# Patient Record
Sex: Female | Born: 1955
Health system: Southern US, Community
[De-identification: ages and names within clinical notes are randomized; demographics above are authoritative.]

## PROBLEM LIST (undated history)

## (undated) DIAGNOSIS — E785 Hyperlipidemia, unspecified: Secondary | ICD-10-CM

## (undated) DIAGNOSIS — R945 Abnormal results of liver function studies: Secondary | ICD-10-CM

## (undated) DIAGNOSIS — M1990 Unspecified osteoarthritis, unspecified site: Secondary | ICD-10-CM

## (undated) DIAGNOSIS — I10 Essential (primary) hypertension: Secondary | ICD-10-CM

## (undated) DIAGNOSIS — F32A Depression, unspecified: Secondary | ICD-10-CM

## (undated) DIAGNOSIS — F419 Anxiety disorder, unspecified: Secondary | ICD-10-CM

## (undated) DIAGNOSIS — K649 Unspecified hemorrhoids: Secondary | ICD-10-CM

## (undated) DIAGNOSIS — R7989 Other specified abnormal findings of blood chemistry: Secondary | ICD-10-CM

## (undated) DIAGNOSIS — E559 Vitamin D deficiency, unspecified: Secondary | ICD-10-CM

## (undated) DIAGNOSIS — F329 Major depressive disorder, single episode, unspecified: Secondary | ICD-10-CM

## (undated) DIAGNOSIS — K219 Gastro-esophageal reflux disease without esophagitis: Secondary | ICD-10-CM

## (undated) HISTORY — DX: Essential (primary) hypertension: I10

## (undated) HISTORY — DX: Abnormal results of liver function studies: R94.5

## (undated) HISTORY — DX: Unspecified hemorrhoids: K64.9

## (undated) HISTORY — DX: Anxiety disorder, unspecified: F41.9

## (undated) HISTORY — DX: Hyperlipidemia, unspecified: E78.5

## (undated) HISTORY — DX: Other specified abnormal findings of blood chemistry: R79.89

## (undated) HISTORY — DX: Vitamin D deficiency, unspecified: E55.9

## (undated) HISTORY — DX: Gastro-esophageal reflux disease without esophagitis: K21.9

## (undated) HISTORY — DX: Depression, unspecified: F32.A

## (undated) HISTORY — DX: Major depressive disorder, single episode, unspecified: F32.9

## (undated) HISTORY — DX: Unspecified osteoarthritis, unspecified site: M19.90

---
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
## 2002-02-02 HISTORY — PX: OTHER SURGICAL HISTORY: SHX169

## 2006-02-02 HISTORY — PX: ANKLE FRACTURE SURGERY: SHX122

## 2006-05-28 ENCOUNTER — Ambulatory Visit: Payer: Self-pay | Admitting: Unknown Physician Specialty

## 2006-05-28 ENCOUNTER — Other Ambulatory Visit: Payer: Self-pay

## 2006-05-29 ENCOUNTER — Inpatient Hospital Stay: Payer: Self-pay | Admitting: Specialist

## 2006-08-19 ENCOUNTER — Ambulatory Visit: Payer: Self-pay | Admitting: Specialist

## 2006-08-20 ENCOUNTER — Ambulatory Visit: Payer: Self-pay | Admitting: Specialist

## 2012-06-09 ENCOUNTER — Ambulatory Visit: Payer: Self-pay | Admitting: Otolaryngology

## 2013-03-08 LAB — CBC AND DIFFERENTIAL
HCT: 46 % (ref 36–46)
Hemoglobin: 15.8 g/dL (ref 12.0–16.0)
Neutrophils Absolute: 4 /uL
Platelets: 311 10*3/uL (ref 150–399)
WBC: 6.3 10*3/mL

## 2013-03-08 LAB — HEPATIC FUNCTION PANEL
ALT: 10 U/L (ref 7–35)
AST: 25 U/L (ref 13–35)
Alkaline Phosphatase: 132 U/L — AB (ref 25–125)

## 2013-03-08 LAB — LIPID PANEL
Cholesterol: 244 mg/dL — AB (ref 0–200)
HDL: 47 mg/dL (ref 35–70)
LDL CALC: 152 mg/dL
LDl/HDL Ratio: 3.2
Triglycerides: 227 mg/dL — AB (ref 40–160)

## 2013-03-08 LAB — BASIC METABOLIC PANEL
BUN: 11 mg/dL (ref 4–21)
CREATININE: 0.9 mg/dL (ref <=1.1)
Glucose: 105 mg/dL
POTASSIUM: 4.3 mmol/L (ref 3.4–5.3)
SODIUM: 138 mmol/L (ref 137–147)

## 2013-03-08 LAB — TSH: TSH: 2.85 u[IU]/mL (ref <=5.90)

## 2013-03-18 LAB — HEPATIC FUNCTION PANEL
ALK PHOS: 132 U/L — AB (ref 25–125)
AST: 25 U/L (ref 13–35)

## 2014-07-20 DIAGNOSIS — F419 Anxiety disorder, unspecified: Secondary | ICD-10-CM | POA: Insufficient documentation

## 2014-07-20 DIAGNOSIS — E559 Vitamin D deficiency, unspecified: Secondary | ICD-10-CM | POA: Insufficient documentation

## 2014-07-20 DIAGNOSIS — J189 Pneumonia, unspecified organism: Secondary | ICD-10-CM | POA: Insufficient documentation

## 2014-07-20 DIAGNOSIS — Z7289 Other problems related to lifestyle: Secondary | ICD-10-CM | POA: Insufficient documentation

## 2014-07-20 DIAGNOSIS — Z789 Other specified health status: Secondary | ICD-10-CM | POA: Insufficient documentation

## 2014-07-20 DIAGNOSIS — E785 Hyperlipidemia, unspecified: Secondary | ICD-10-CM | POA: Insufficient documentation

## 2014-07-20 DIAGNOSIS — F32A Depression, unspecified: Secondary | ICD-10-CM | POA: Insufficient documentation

## 2014-07-20 DIAGNOSIS — F410 Panic disorder [episodic paroxysmal anxiety] without agoraphobia: Secondary | ICD-10-CM | POA: Insufficient documentation

## 2014-07-20 DIAGNOSIS — M1990 Unspecified osteoarthritis, unspecified site: Secondary | ICD-10-CM | POA: Insufficient documentation

## 2014-07-20 DIAGNOSIS — K219 Gastro-esophageal reflux disease without esophagitis: Secondary | ICD-10-CM | POA: Insufficient documentation

## 2014-07-20 DIAGNOSIS — R945 Abnormal results of liver function studies: Secondary | ICD-10-CM | POA: Insufficient documentation

## 2014-07-20 DIAGNOSIS — I10 Essential (primary) hypertension: Secondary | ICD-10-CM | POA: Insufficient documentation

## 2014-07-20 DIAGNOSIS — R7989 Other specified abnormal findings of blood chemistry: Secondary | ICD-10-CM | POA: Insufficient documentation

## 2014-07-20 DIAGNOSIS — K649 Unspecified hemorrhoids: Secondary | ICD-10-CM | POA: Insufficient documentation

## 2014-07-20 DIAGNOSIS — F329 Major depressive disorder, single episode, unspecified: Secondary | ICD-10-CM | POA: Insufficient documentation

## 2014-08-27 ENCOUNTER — Telehealth: Payer: Self-pay

## 2014-08-27 NOTE — Telephone Encounter (Signed)
LMTCB, reviewed lab results-cholesterol very elevated advise patient to take her Simvastatin every day and work on habits, sugar is in almost diabetic range. Re check in 3 weeks (lipids, hepatic and A1C at that time). Labs did not go through computer-print out copy on desk 205-aa

## 2014-08-28 NOTE — Telephone Encounter (Signed)
Patient advised. Pt has appt on August 18th, advised patient we will order tests at that time and to make sure she is taking medication every day.-aa

## 2014-08-28 NOTE — Telephone Encounter (Signed)
Pt is returning call.  CB#[phone]/MW

## 2014-09-06 ENCOUNTER — Encounter: Payer: Self-pay | Admitting: Family Medicine

## 2014-09-20 ENCOUNTER — Ambulatory Visit (INDEPENDENT_AMBULATORY_CARE_PROVIDER_SITE_OTHER): Payer: No Typology Code available for payment source | Admitting: Family Medicine

## 2014-09-20 VITALS — BP 122/76 | HR 84 | Temp 97.9°F | Resp 16 | Wt 140.0 lb

## 2014-09-20 DIAGNOSIS — F411 Generalized anxiety disorder: Secondary | ICD-10-CM

## 2014-09-20 DIAGNOSIS — F329 Major depressive disorder, single episode, unspecified: Secondary | ICD-10-CM | POA: Diagnosis not present

## 2014-09-20 DIAGNOSIS — F32A Depression, unspecified: Secondary | ICD-10-CM

## 2014-09-20 DIAGNOSIS — E785 Hyperlipidemia, unspecified: Secondary | ICD-10-CM | POA: Diagnosis not present

## 2014-09-20 NOTE — Progress Notes (Signed)
Patient ID: Melanie Mitchell, female   DOB: [birth year], 59 y.o.   MRN: [id number]   MARIJOSE CURINGTON  MRN: [id number] DOB: [birth year]  Subjective:  HPI   1. Depression Patient is a 59 year old female who presents for follow up of her depression.  Her last visit was on 06/28/14.  At that time her depression was worsening due to the loss of her mother just prior to that visit.  She chose not to make any changes and wanted to see how she did during the time until this visit.  She is currently on Sertraline 50 mg daily.    2. GAD (generalized anxiety disorder) Patient is also here to follow up on her anxiety.  She takes Alprazolam for this and feels it helps with her symptoms.   3. Hyperlipidemia Patient had elevation of her levels on the last check but she had not been taking her medication.  She states she has been taking it every day now and it is time for her level to be checked.  Patient Active Problem List   Diagnosis Date Noted  . Abnormal LFTs 07/20/2014  . Alcohol drinker 07/20/2014  . Anxiety 07/20/2014  . Arthritis 07/20/2014  . Pneumonia, organism unspecified 07/20/2014  . Clinical depression 07/20/2014  . Acid reflux 07/20/2014  . HLD (hyperlipidemia) 07/20/2014  . BP (high blood pressure) 07/20/2014  . Hemorrhoid 07/20/2014  . Avitaminosis D 07/20/2014  . Panic attack 07/20/2014    Past Medical History  Diagnosis Date  . Vitamin D deficiency   . Hyperlipidemia   . Depression   . Anxiety   . Hypertension   . GERD (gastroesophageal reflux disease)   . Hemorrhoids   . Arthritis   . Abnormal LFTs     (Pt told to cut back on alcohol and tylenol.)    Social History   Social History  . Marital Status: Single    Spouse Name: N/A  . Number of Children: N/A  . Years of Education: N/A   Occupational History  . Not on file.   Social History Main Topics  . Smoking status: Current Every Day Smoker -- 1.00 packs/day    Types: Cigarettes  . Smokeless tobacco: Not  on file  . Alcohol Use: Yes     Comment: Occasional alcohol use.  . Drug Use: Not on file  . Sexual Activity: Not on file   Other Topics Concern  . Not on file   Social History Narrative  . No narrative on file    Outpatient Prescriptions Prior to Visit  Medication Sig Dispense Refill  . Cholecalciferol (VITAMIN D) 2000 UNITS tablet Take by mouth.    . clonazePAM (KLONOPIN) 1 MG tablet Take by mouth.    Marland Kitchen HYDROcodone-acetaminophen (VICODIN) 5-500 MG per tablet Take by mouth.    . losartan-hydrochlorothiazide (HYZAAR) 50-12.5 MG per tablet Take by mouth.    Marland Kitchen omeprazole (PRILOSEC) 20 MG capsule Take by mouth.    . sertraline (ZOLOFT) 50 MG tablet Take by mouth.    . simvastatin (ZOCOR) 40 MG tablet Take by mouth.     No facility-administered medications prior to visit.    Allergies  Allergen Reactions  . Atorvastatin Diarrhea  . Penicillins     Review of Systems  Constitutional: Negative.   Respiratory: Negative.   Cardiovascular: Negative.   Neurological: Negative for dizziness and headaches.  Psychiatric/Behavioral: Positive for depression. Negative for suicidal ideas and memory loss. The patient is nervous/anxious. The patient does not  have insomnia.    Objective:  There were no vitals taken for this visit.  Physical Exam  Constitutional: She is oriented to person, place, and time and well-developed, well-nourished, and in no distress.  HENT:  Head: Normocephalic and atraumatic.  Right Ear: External ear normal.  Left Ear: External ear normal.  Nose: Nose normal.  Eyes: Conjunctivae are normal.  Neck: Neck supple.  Cardiovascular: Normal rate, regular rhythm and normal heart sounds.   Pulmonary/Chest: Effort normal and breath sounds normal.  Neurological: She is alert and oriented to person, place, and time.  Skin: Skin is warm and dry.  Psychiatric: Mood, memory, affect and judgment normal.    Assessment and Plan :   1. Depression   2. GAD (generalized  anxiety disorder)   3. Hyperlipidemia  - Lipid Panel With LDL/HDL Ratio - COMPLETE METABOLIC PANEL WITH GFR I have done the exam and reviewed the above chart and it is accurate to the best of my knowledge.   Julieanne Manson MD [facility] 09/20/2014 3:21 PM

## 2014-12-06 ENCOUNTER — Encounter: Payer: Self-pay | Admitting: Family Medicine

## 2014-12-25 ENCOUNTER — Telehealth: Payer: Self-pay | Admitting: Family Medicine

## 2014-12-25 NOTE — Telephone Encounter (Signed)
Please review-aa 

## 2014-12-25 NOTE — Telephone Encounter (Signed)
ok 

## 2014-12-25 NOTE — Telephone Encounter (Signed)
Pt contacted office for refill request on the following medications: clonazePAM (KLONOPIN) 1 MG tablet to [facility]. Pt stated she needs it before 12/27/14 and needs enough to last until her appt in Jan. Thanks TNP

## 2014-12-28 MED ORDER — CLONAZEPAM 1 MG PO TABS: 1.0000 mg | ORAL_TABLET | Freq: Two times a day (BID) | ORAL | Status: DC | PRN

## 2014-12-28 NOTE — Telephone Encounter (Signed)
Done-aa 

## 2014-12-31 ENCOUNTER — Other Ambulatory Visit: Payer: Self-pay | Admitting: Family Medicine

## 2015-02-13 ENCOUNTER — Encounter: Payer: Self-pay | Admitting: Family Medicine

## 2015-02-21 ENCOUNTER — Encounter: Payer: Self-pay | Admitting: Family Medicine

## 2015-02-21 ENCOUNTER — Ambulatory Visit (INDEPENDENT_AMBULATORY_CARE_PROVIDER_SITE_OTHER): Payer: BLUE CROSS/BLUE SHIELD | Admitting: Family Medicine

## 2015-02-21 VITALS — BP 142/70 | HR 72 | Temp 97.5°F | Resp 12 | Ht 67.5 in | Wt 140.0 lb

## 2015-02-21 DIAGNOSIS — Z1211 Encounter for screening for malignant neoplasm of colon: Secondary | ICD-10-CM | POA: Diagnosis not present

## 2015-02-21 DIAGNOSIS — R0989 Other specified symptoms and signs involving the circulatory and respiratory systems: Secondary | ICD-10-CM

## 2015-02-21 DIAGNOSIS — Z720 Tobacco use: Secondary | ICD-10-CM

## 2015-02-21 DIAGNOSIS — Z0000 Encounter for general adult medical examination without abnormal findings: Secondary | ICD-10-CM

## 2015-02-21 DIAGNOSIS — Z1239 Encounter for other screening for malignant neoplasm of breast: Secondary | ICD-10-CM | POA: Diagnosis not present

## 2015-02-21 DIAGNOSIS — Z124 Encounter for screening for malignant neoplasm of cervix: Secondary | ICD-10-CM | POA: Diagnosis not present

## 2015-02-21 DIAGNOSIS — F411 Generalized anxiety disorder: Secondary | ICD-10-CM

## 2015-02-21 NOTE — Progress Notes (Signed)
Patient ID: Melanie Mitchell, female   DOB: March 21, 1955, 60 y.o.   MRN: [id number]  Visit Date: 02/21/2015  Today's Provider: Megan Mans, MD   Chief Complaint  Patient presents with  . Annual Exam   Subjective:  Melanie Mitchell is a 60 y.o. female who presents today for health maintenance and complete physical. She feels well. She reports exercising none due to broken ankle from years ago. She reports she is sleeping well. Immunization History  Administered Date(s) Administered  . Influenza,inj,Quad PF,36+ Mos 11/21/2014  . Pneumococcal Polysaccharide-23 08/15/2012  . Tdap 08/15/2012   LAST BMD 08/20/11 some osteopenia, some osteoporosis.  Pap smear 06/10/10 normal, never had hysterectomy, had 1 abnormal pap smear years ago with Dr. Barnabas Lister and they did something in the office and no issues after that.  Mammogram 08/18/11  Colonoscopy- pre patient years ago more than 10 years ago.  Patient states she gets recurrent hemorrhoids.  Review of Systems  Constitutional: Negative.   HENT: Negative.   Eyes: Negative.   Respiratory: Negative.   Cardiovascular: Positive for leg swelling.  Gastrointestinal: Negative.   Endocrine: Negative.   Genitourinary: Negative.   Musculoskeletal: Negative.   Skin: Negative.   Allergic/Immunologic: Negative.   Neurological: Negative.   Hematological: Negative.   Psychiatric/Behavioral: Negative.     Social History   Social History  . Marital Status: Single    Spouse Name: N/A  . Number of Children: N/A  . Years of Education: N/A   Occupational History  . Not on file.   Social History Main Topics  . Smoking status: Current Every Day Smoker -- 1.00 packs/day for 30 years    Types: Cigarettes  . Smokeless tobacco: Never Used  . Alcohol Use: Yes     Comment: drinks 3 to 4 beers 2 to 3 times a week.  . Drug Use: No  . Sexual Activity: No   Other Topics Concern  . Not on file   Social History Narrative    Patient Active  Problem List   Diagnosis Date Noted  . Abnormal LFTs 07/20/2014  . Alcohol drinker (HCC) 07/20/2014  . Anxiety 07/20/2014  . Arthritis 07/20/2014  . Pneumonia, organism unspecified 07/20/2014  . Clinical depression 07/20/2014  . Acid reflux 07/20/2014  . HLD (hyperlipidemia) 07/20/2014  . BP (high blood pressure) 07/20/2014  . Hemorrhoid 07/20/2014  . Avitaminosis D 07/20/2014  . Panic attack 07/20/2014    Past Surgical History  Procedure Laterality Date  . Ankle fracture surgery  2008    steel plate and screws put in  . Stone in the [employer] gland  2004  . 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 Tonsillectomy  1960    Her family history includes Anxiety disorder in her mother; Cancer in her father and mother; Hypercholesterolemia in her mother; Hypertension in her mother; Multiple sclerosis in her maternal aunt.    Outpatient Prescriptions Prior to Visit  Medication Sig Dispense Refill  . Cholecalciferol (VITAMIN D) 2000 UNITS tablet Take by mouth.    . clonazePAM (KLONOPIN) 1 MG tablet Take 1 tablet (1 mg total) by mouth 2 (two) times daily as needed for anxiety. 30 tablet 5  . HYDROcodone-acetaminophen (VICODIN) 5-500 MG per tablet Take by mouth.    . losartan-hydrochlorothiazide (HYZAAR) 50-12.5 MG per tablet Take by mouth.    Marland Kitchen omeprazole (PRILOSEC) 20 MG capsule Take by mouth.    . sertraline (ZOLOFT) 50 MG tablet Take by mouth.    Marland Kitchen  simvastatin (ZOCOR) 40 MG tablet Take by mouth.     No facility-administered medications prior to visit.    Patient Care Team: Maple Hudson., MD as PCP - General (Family Medicine)     Objective:   Vitals:  Filed Vitals:   02/21/15 1033  BP: 142/70  Pulse: 72  Temp: 97.5 F (36.4 C)  Resp: 12  Height: 5' 7.5" (1.715 m)  Weight: 140 lb (63.504 kg)    Physical Exam  Constitutional: She is oriented to person, place, and time. She appears well-developed and well-nourished.  HENT:  Head:  Normocephalic and atraumatic.  Right Ear: External ear normal.  Left Ear: External ear normal.  Eyes: Conjunctivae are normal. Pupils are equal, round, and reactive to light.  Neck: Normal range of motion. Neck supple.  Cardiovascular: Normal rate, normal heart sounds and intact distal pulses.   No murmur heard. Carotid bruit present bilaterally.  Pulmonary/Chest: Breath sounds normal. No respiratory distress. She has no wheezes. She has no rales.  Abdominal: Soft. Bowel sounds are normal. There is no tenderness. There is no rebound.  Genitourinary: Vagina normal and uterus normal. Guaiac negative stool. No vaginal discharge found.  Atrophy present, cystocele present.  Musculoskeletal: Normal range of motion. She exhibits no edema or tenderness.  Neurological: She is alert and oriented to person, place, and time.  Skin: Skin is warm and dry. No rash noted. No erythema.  Psychiatric: She has a normal mood and affect. Her behavior is normal. Judgment and thought content normal.  Nursing note and vitals reviewed.    Depression Screen PHQ 2/9 Scores 02/21/2015  PHQ - 2 Score 1      Assessment & Plan:   1. Annual physical exam Check labs. Discussed with patient on decreasing alcohol use and tobacco use. Will re check on that in 2 months. - CBC with Differential/Platelet - Comprehensive metabolic panel - Lipid Panel With LDL/HDL Ratio - TSH - POCT Urinalysis Dipstick  2. Colon cancer screening patient declines colonoscopy referral at this time. Will re address on the next visit. - IFOBT POC (occult bld, rslt in office)  3. Breast cancer screening - MM Digital Screening; Future  4. Pap smear for cervical cancer screening - Pap IG and HPV (high risk) DNA detection  5. Carotid bruit, unspecified laterality New. Refer to Dr. Gilda Crease.Patient advised to quit smoking - Ambulatory referral to Vascular Surgery  6. GAD (generalized anxiety disorder) Stable. Depression screen [facility]  2/9 02/21/2015 02/21/2015  Decreased Interest 2 0  Down, Depressed, Hopeless 1 1  PHQ - 2 Score 3 1  Altered sleeping 2 -  Tired, decreased energy 1 -  Change in appetite 1 -  Feeling bad or failure about yourself  1 -  Trouble concentrating 0 -  Moving slowly or fidgety/restless 0 -  Suicidal thoughts 0 -  PHQ-9 Score 8 -  Difficult doing work/chores Somewhat difficult -    7. Tobacco use Patient needs to quit.  Patient was seen and examined by Dr. Bosie Clos and note was scribed by Samara Deist, RMA.

## 2015-02-22 ENCOUNTER — Telehealth: Payer: Self-pay

## 2015-02-22 LAB — CBC WITH DIFFERENTIAL/PLATELET
BASOS ABS: 0 10*3/uL (ref 0.0–0.2)
BASOS: 0 %
EOS (ABSOLUTE): 0.1 10*3/uL (ref 0.0–0.4)
EOS: 1 %
HEMATOCRIT: 42 % (ref 34.0–46.6)
HEMOGLOBIN: 13.9 g/dL (ref 11.1–15.9)
IMMATURE GRANS (ABS): 0 10*3/uL (ref 0.0–0.1)
Immature Granulocytes: 0 %
LYMPHS: 28 %
Lymphocytes Absolute: 2.1 10*3/uL (ref 0.7–3.1)
MCH: 30.1 pg (ref 26.6–33.0)
MCHC: 33.1 g/dL (ref 31.5–35.7)
MCV: 91 fL (ref 79–97)
MONOCYTES: 5 %
Monocytes Absolute: 0.4 10*3/uL (ref 0.1–0.9)
NEUTROS ABS: 5 10*3/uL (ref 1.4–7.0)
Neutrophils: 66 %
Platelets: 240 10*3/uL (ref 150–379)
RBC: 4.62 x10E6/uL (ref 3.77–5.28)
RDW: 12.8 % (ref 12.3–15.4)
WBC: 7.6 10*3/uL (ref 3.4–10.8)

## 2015-02-22 LAB — COMPREHENSIVE METABOLIC PANEL
ALBUMIN: 4.3 g/dL (ref 3.6–4.8)
ALT: 16 IU/L (ref 0–32)
AST: 27 IU/L (ref 0–40)
Albumin/Globulin Ratio: 1.5 (ref 1.1–2.5)
Alkaline Phosphatase: 123 IU/L — ABNORMAL HIGH (ref 39–117)
BILIRUBIN TOTAL: 0.3 mg/dL (ref 0.0–1.2)
BUN / CREAT RATIO: 13 (ref 11–26)
BUN: 10 mg/dL (ref 8–27)
CHLORIDE: 93 mmol/L — AB (ref 96–106)
CO2: 23 mmol/L (ref 18–29)
CREATININE: 0.76 mg/dL (ref 0.57–1.00)
Calcium: 9.4 mg/dL (ref 8.7–10.3)
GFR calc non Af Amer: 86 mL/min/{1.73_m2} (ref >59)
GFR, EST AFRICAN AMERICAN: 99 mL/min/{1.73_m2} (ref >59)
GLOBULIN, TOTAL: 2.8 g/dL (ref 1.5–4.5)
GLUCOSE: 105 mg/dL — AB (ref 65–99)
Potassium: 4.3 mmol/L (ref 3.5–5.2)
Sodium: 136 mmol/L (ref 134–144)
TOTAL PROTEIN: 7.1 g/dL (ref 6.0–8.5)

## 2015-02-22 LAB — LIPID PANEL WITH LDL/HDL RATIO
Cholesterol, Total: 199 mg/dL (ref 100–199)
HDL: 59 mg/dL (ref >39)
LDL CALC: 117 mg/dL — AB (ref 0–99)
LDl/HDL Ratio: 2 ratio units (ref 0.0–3.2)
Triglycerides: 117 mg/dL (ref 0–149)
VLDL CHOLESTEROL CAL: 23 mg/dL (ref 5–40)

## 2015-02-22 LAB — TSH: TSH: 3.64 u[IU]/mL (ref 0.450–4.500)

## 2015-02-22 NOTE — Telephone Encounter (Signed)
-----   Message from Maple Hudson., MD sent at 02/22/2015  9:49 AM EST ----- Labs ok

## 2015-02-22 NOTE — Telephone Encounter (Signed)
lmtc-aa

## 2015-02-22 NOTE — Telephone Encounter (Signed)
Pt informed and voiced understanding of results. 

## 2015-02-25 ENCOUNTER — Other Ambulatory Visit: Payer: Self-pay | Admitting: Vascular Surgery

## 2015-02-25 DIAGNOSIS — I6523 Occlusion and stenosis of bilateral carotid arteries: Secondary | ICD-10-CM

## 2015-02-27 LAB — PAP IG AND HPV HIGH-RISK
HPV, HIGH-RISK: NEGATIVE
PAP SMEAR COMMENT: 0

## 2015-03-05 ENCOUNTER — Ambulatory Visit
Admission: RE | Admit: 2015-03-05 | Discharge: 2015-03-05 | Disposition: A | Discharge status: Home / Self Care | Payer: BLUE CROSS/BLUE SHIELD | Source: Ambulatory Visit | Attending: Vascular Surgery | Admitting: Vascular Surgery

## 2015-03-05 DIAGNOSIS — I6523 Occlusion and stenosis of bilateral carotid arteries: Secondary | ICD-10-CM

## 2015-03-05 DIAGNOSIS — I708 Atherosclerosis of other arteries: Secondary | ICD-10-CM | POA: Diagnosis not present

## 2015-03-05 MED ORDER — IOHEXOL 350 MG/ML SOLN
80.0000 mL | Freq: Once | INTRAVENOUS | Status: AC | PRN
  Administered 2015-03-05: 80 mL via INTRAVENOUS

## 2015-03-08 DIAGNOSIS — I739 Peripheral vascular disease, unspecified: Secondary | ICD-10-CM

## 2015-03-08 DIAGNOSIS — E782 Mixed hyperlipidemia: Secondary | ICD-10-CM | POA: Insufficient documentation

## 2015-03-08 DIAGNOSIS — R0789 Other chest pain: Secondary | ICD-10-CM | POA: Insufficient documentation

## 2015-03-08 DIAGNOSIS — I779 Disorder of arteries and arterioles, unspecified: Secondary | ICD-10-CM | POA: Insufficient documentation

## 2015-03-11 ENCOUNTER — Telehealth: Payer: Self-pay | Admitting: Family Medicine

## 2015-03-11 NOTE — Telephone Encounter (Signed)
Information for you-aa

## 2015-03-11 NOTE — Telephone Encounter (Signed)
Pt called saying she is going to be having the operation for her clogged artery in her neck.  Her call back is [phone]  Thanks Barth Kirks

## 2015-03-11 NOTE — Telephone Encounter (Signed)
Very good thanks.

## 2015-03-13 ENCOUNTER — Other Ambulatory Visit: Payer: Self-pay | Admitting: Vascular Surgery

## 2015-03-26 ENCOUNTER — Ambulatory Visit
Admission: RE | Admit: 2015-03-26 | Discharge: 2015-03-26 | Disposition: A | Discharge status: Home / Self Care | Payer: BLUE CROSS/BLUE SHIELD | Source: Ambulatory Visit | Attending: Vascular Surgery | Admitting: Vascular Surgery

## 2015-03-26 ENCOUNTER — Encounter
Admission: RE | Admit: 2015-03-26 | Discharge: 2015-03-26 | Disposition: A | Discharge status: Home / Self Care | Payer: BLUE CROSS/BLUE SHIELD | Source: Ambulatory Visit | Attending: Vascular Surgery | Admitting: Vascular Surgery

## 2015-03-26 DIAGNOSIS — Z01812 Encounter for preprocedural laboratory examination: Secondary | ICD-10-CM | POA: Diagnosis not present

## 2015-03-26 DIAGNOSIS — Z01818 Encounter for other preprocedural examination: Secondary | ICD-10-CM | POA: Diagnosis not present

## 2015-03-26 LAB — CBC WITH DIFFERENTIAL/PLATELET
BASOS PCT: 1 %
Basophils Absolute: 0 10*3/uL (ref 0–0.1)
Eosinophils Absolute: 0.1 10*3/uL (ref 0–0.7)
Eosinophils Relative: 1 %
HEMATOCRIT: 43.3 % (ref 35.0–47.0)
HEMOGLOBIN: 14.6 g/dL (ref 12.0–16.0)
LYMPHS ABS: 1.6 10*3/uL (ref 1.0–3.6)
Lymphocytes Relative: 22 %
MCH: 30.7 pg (ref 26.0–34.0)
MCHC: 33.7 g/dL (ref 32.0–36.0)
MCV: 90.9 fL (ref 80.0–100.0)
MONOS PCT: 5 %
Monocytes Absolute: 0.3 10*3/uL (ref 0.2–0.9)
NEUTROS ABS: 5.1 10*3/uL (ref 1.4–6.5)
NEUTROS PCT: 71 %
Platelets: 228 10*3/uL (ref 150–440)
RBC: 4.77 MIL/uL (ref 3.80–5.20)
RDW: 13.3 % (ref 11.5–14.5)
WBC: 7.1 10*3/uL (ref 3.6–11.0)

## 2015-03-26 LAB — PROTIME-INR
INR: 1
Prothrombin Time: 13.4 seconds (ref 11.4–15.0)

## 2015-03-26 LAB — BASIC METABOLIC PANEL
Anion gap: 7 (ref 5–15)
BUN: 11 mg/dL (ref 6–20)
CO2: 30 mmol/L (ref 22–32)
Calcium: 9.3 mg/dL (ref 8.9–10.3)
Chloride: 98 mmol/L — ABNORMAL LOW (ref 101–111)
Creatinine, Ser: 0.78 mg/dL (ref 0.44–1.00)
GFR calc Af Amer: >60 mL/min (ref >60)
GFR calc non Af Amer: >60 mL/min (ref >60)
Glucose, Bld: 107 mg/dL — ABNORMAL HIGH (ref 65–99)
Potassium: 3.5 mmol/L (ref 3.5–5.1)
Sodium: 135 mmol/L (ref 135–145)

## 2015-03-26 LAB — SURGICAL PCR SCREEN
MRSA, PCR: NEGATIVE
Staphylococcus aureus: POSITIVE — AB

## 2015-03-26 LAB — TYPE AND SCREEN
ABO/RH(D): A POS
Antibody Screen: NEGATIVE

## 2015-03-26 LAB — ABO/RH: ABO/RH(D): A POS

## 2015-03-26 LAB — APTT: aPTT: 29 seconds (ref 24–36)

## 2015-03-26 NOTE — Patient Instructions (Signed)
  Your procedure is scheduled on:04/05/15 Report to Day Surgery. To find out your arrival time please call [phone] between 1PM - 3PM on 04/04/15  Remember: Instructions that are not followed completely may result in serious medical risk, up to and including death, or upon the discretion of your surgeon and anesthesiologist your surgery may need to be rescheduled.    __x__ 1. Do not eat food or drink liquids after midnight. No gum chewing or hard candies.     __x__ 2. No Alcohol for 24 hours before or after surgery.   ____ 3. Bring all medications with you on the day of surgery if instructed.    __x__ 4. Notify your doctor if there is any change in your medical condition     (cold, fever, infections).     Do not wear jewelry, make-up, hairpins, clips or nail polish.  Do not wear lotions, powders, or perfumes. You may wear deodorant.  Do not shave 48 hours prior to surgery. Men may shave face and neck.  Do not bring valuables to the hospital.    [facility] is not responsible for any belongings or valuables.               Contacts, dentures or bridgework may not be worn into surgery.  Leave your suitcase in the car. After surgery it may be brought to your room.  For patients admitted to the hospital, discharge time is determined by your                treatment team.   Patients discharged the day of surgery will not be allowed to drive home.   Please read over the following fact sheets that you were given:   MRSA Information and Surgical Site Infection Prevention   ____ Take these medicines the morning of surgery with A SIP OF WATER:    1. Clonazepam  2. sertaline  3. Omeprazole the night before surgery and the morning of surgery  4. Bring lasartan/hctz with you to the hospital  5.  6.  ____ Fleet Enema (as directed)   __x__ Use CHG Soap as directed  ____ Use inhalers on the day of surgery  ____ Stop metformin 2 days prior to surgery    ____ Take 1/2 of usual insulin  dose the night before surgery and none on the morning of surgery.   ____ Stop Coumadin/Plavix/aspirin on    _x___ Stop Anti-inflammatories on tylenol only until after surgery   ____ Stop supplements until after surgery.    ____ Bring C-Pap to the hospital.

## 2015-03-26 NOTE — Pre-Procedure Instructions (Signed)
Staph Positive. MRSA negative.  Results faxed to office.

## 2015-03-26 NOTE — Pre-Procedure Instructions (Signed)
Office notified that H&P will be greater than 81 days old on day of surgery.  Must be repeated.

## 2015-04-05 ENCOUNTER — Encounter
Admission: RE | Disposition: A | Discharge status: Home / Self Care | Payer: Self-pay | Source: Ambulatory Visit | Attending: Vascular Surgery

## 2015-04-05 ENCOUNTER — Encounter: Payer: Self-pay | Admitting: *Deleted

## 2015-04-05 ENCOUNTER — Inpatient Hospital Stay
Admission: RE | Admit: 2015-04-05 | Discharge: 2015-04-07 | DRG: 039 | Disposition: A | Discharge status: Home / Self Care | Payer: BLUE CROSS/BLUE SHIELD | Source: Ambulatory Visit | Attending: Vascular Surgery | Admitting: Vascular Surgery

## 2015-04-05 ENCOUNTER — Inpatient Hospital Stay: Payer: BLUE CROSS/BLUE SHIELD | Admitting: *Deleted

## 2015-04-05 DIAGNOSIS — M1990 Unspecified osteoarthritis, unspecified site: Secondary | ICD-10-CM | POA: Diagnosis present

## 2015-04-05 DIAGNOSIS — Z880 Allergy status to penicillin: Secondary | ICD-10-CM

## 2015-04-05 DIAGNOSIS — I6523 Occlusion and stenosis of bilateral carotid arteries: Principal | ICD-10-CM | POA: Diagnosis present

## 2015-04-05 DIAGNOSIS — Z79899 Other long term (current) drug therapy: Secondary | ICD-10-CM

## 2015-04-05 DIAGNOSIS — I959 Hypotension, unspecified: Secondary | ICD-10-CM | POA: Diagnosis not present

## 2015-04-05 DIAGNOSIS — Z836 Family history of other diseases of the respiratory system: Secondary | ICD-10-CM | POA: Diagnosis not present

## 2015-04-05 DIAGNOSIS — F17210 Nicotine dependence, cigarettes, uncomplicated: Secondary | ICD-10-CM | POA: Diagnosis present

## 2015-04-05 DIAGNOSIS — I739 Peripheral vascular disease, unspecified: Secondary | ICD-10-CM | POA: Diagnosis present

## 2015-04-05 DIAGNOSIS — I10 Essential (primary) hypertension: Secondary | ICD-10-CM | POA: Diagnosis present

## 2015-04-05 DIAGNOSIS — K219 Gastro-esophageal reflux disease without esophagitis: Secondary | ICD-10-CM | POA: Diagnosis present

## 2015-04-05 DIAGNOSIS — Z8249 Family history of ischemic heart disease and other diseases of the circulatory system: Secondary | ICD-10-CM

## 2015-04-05 DIAGNOSIS — I6529 Occlusion and stenosis of unspecified carotid artery: Secondary | ICD-10-CM | POA: Diagnosis present

## 2015-04-05 HISTORY — PX: ENDARTERECTOMY: SHX5162

## 2015-04-05 LAB — MRSA PCR SCREENING: MRSA BY PCR: NEGATIVE

## 2015-04-05 SURGERY — ENDARTERECTOMY, CAROTID
Anesthesia: General | Laterality: Left

## 2015-04-05 MED ORDER — ROCURONIUM BROMIDE 100 MG/10ML IV SOLN
INTRAVENOUS | Status: DC | PRN
  Administered 2015-04-05: 10 mg via INTRAVENOUS
  Administered 2015-04-05: 40 mg via INTRAVENOUS

## 2015-04-05 MED ORDER — HYDROCHLOROTHIAZIDE 12.5 MG PO CAPS
12.5000 mg | ORAL_CAPSULE | Freq: Every day | ORAL | Status: DC
  Administered 2015-04-07: 12.5 mg via ORAL
  Filled 2015-04-05: qty 1

## 2015-04-05 MED ORDER — SODIUM CHLORIDE 0.9 % IV SOLN
1000.0000 ug | INTRAVENOUS | Status: DC | PRN
  Administered 2015-04-05: .1 ug/kg/min via INTRAVENOUS

## 2015-04-05 MED ORDER — ONDANSETRON HCL 4 MG/2ML IJ SOLN: 4.0000 mg | Freq: Four times a day (QID) | INTRAMUSCULAR | Status: DC | PRN

## 2015-04-05 MED ORDER — DOPAMINE-DEXTROSE 3.2-5 MG/ML-% IV SOLN
3.0000 ug/kg/min | INTRAVENOUS | Status: DC
  Administered 2015-04-05: 2.016 ug/kg/min via INTRAVENOUS
  Administered 2015-04-05: 4.031 ug/kg/min via INTRAVENOUS

## 2015-04-05 MED ORDER — HEPARIN SODIUM (PORCINE) 5000 UNIT/ML IJ SOLN
INTRAMUSCULAR | Status: AC
  Filled 2015-04-05: qty 1

## 2015-04-05 MED ORDER — GLYCOPYRROLATE 0.2 MG/ML IJ SOLN
INTRAMUSCULAR | Status: DC | PRN
  Administered 2015-04-05: 0.2 mg via INTRAVENOUS

## 2015-04-05 MED ORDER — FENTANYL CITRATE (PF) 100 MCG/2ML IJ SOLN
25.0000 ug | INTRAMUSCULAR | Status: DC | PRN
  Administered 2015-04-05 (×3): 50 ug via INTRAVENOUS

## 2015-04-05 MED ORDER — ACETAMINOPHEN 650 MG RE SUPP: 325.0000 mg | RECTAL | Status: DC | PRN

## 2015-04-05 MED ORDER — CLINDAMYCIN PHOSPHATE 300 MG/50ML IV SOLN: 300.0000 mg | Freq: Once | INTRAVENOUS | Status: DC

## 2015-04-05 MED ORDER — LACTATED RINGERS IV SOLN
INTRAVENOUS | Status: DC
  Administered 2015-04-05: 11:00:00 via INTRAVENOUS

## 2015-04-05 MED ORDER — LOSARTAN POTASSIUM 50 MG PO TABS
50.0000 mg | ORAL_TABLET | Freq: Every day | ORAL | Status: DC
  Administered 2015-04-05 – 2015-04-07 (×2): 50 mg via ORAL
  Filled 2015-04-05 (×2): qty 1

## 2015-04-05 MED ORDER — PHENOL 1.4 % MT LIQD
1.0000 | OROMUCOSAL | Status: DC | PRN
  Filled 2015-04-05: qty 177

## 2015-04-05 MED ORDER — CLINDAMYCIN PHOSPHATE 300 MG/50ML IV SOLN
300.0000 mg | Freq: Three times a day (TID) | INTRAVENOUS | Status: AC
  Administered 2015-04-05 – 2015-04-06 (×2): 300 mg via INTRAVENOUS
  Filled 2015-04-05 (×2): qty 50

## 2015-04-05 MED ORDER — PHENYLEPHRINE HCL 10 MG/ML IJ SOLN
INTRAMUSCULAR | Status: DC | PRN
  Administered 2015-04-05 (×2): 100 ug via INTRAVENOUS

## 2015-04-05 MED ORDER — SODIUM CHLORIDE 0.9 % IV SOLN
500.0000 mL | Freq: Once | INTRAVENOUS | Status: AC | PRN
  Administered 2015-04-06: 500 mL via INTRAVENOUS

## 2015-04-05 MED ORDER — KCL IN DEXTROSE-NACL 20-5-0.9 MEQ/L-%-% IV SOLN
INTRAVENOUS | Status: DC
  Administered 2015-04-05 – 2015-04-06 (×3): via INTRAVENOUS
  Filled 2015-04-05 (×5): qty 1000

## 2015-04-05 MED ORDER — CLONAZEPAM 1 MG PO TABS: 1.0000 mg | ORAL_TABLET | Freq: Two times a day (BID) | ORAL | Status: DC | PRN

## 2015-04-05 MED ORDER — CLINDAMYCIN PHOSPHATE 300 MG/50ML IV SOLN
INTRAVENOUS | Status: AC
  Administered 2015-04-05: 300 mg via INTRAVENOUS
  Filled 2015-04-05: qty 50

## 2015-04-05 MED ORDER — ASPIRIN EC 81 MG PO TBEC
81.0000 mg | DELAYED_RELEASE_TABLET | Freq: Every day | ORAL | Status: DC
  Administered 2015-04-05 – 2015-04-07 (×3): 81 mg via ORAL
  Filled 2015-04-05 (×3): qty 1

## 2015-04-05 MED ORDER — ACETAMINOPHEN 325 MG PO TABS: 325.0000 mg | ORAL_TABLET | ORAL | Status: DC | PRN

## 2015-04-05 MED ORDER — ALUM & MAG HYDROXIDE-SIMETH 200-200-20 MG/5ML PO SUSP: 15.0000 mL | ORAL | Status: DC | PRN

## 2015-04-05 MED ORDER — LABETALOL HCL 5 MG/ML IV SOLN: 10.0000 mg | INTRAVENOUS | Status: DC | PRN

## 2015-04-05 MED ORDER — SODIUM CHLORIDE 0.9 % IV SOLN
INTRAVENOUS | Status: DC | PRN
  Administered 2015-04-05: 100 mL via INTRAMUSCULAR

## 2015-04-05 MED ORDER — SODIUM CHLORIDE 0.9 % IV SOLN
Freq: Once | INTRAVENOUS | Status: AC
  Administered 2015-04-05: 15:00:00 via INTRAVENOUS

## 2015-04-05 MED ORDER — MORPHINE SULFATE (PF) 2 MG/ML IV SOLN
2.0000 mg | INTRAVENOUS | Status: DC | PRN
  Administered 2015-04-05 – 2015-04-06 (×3): 2 mg via INTRAVENOUS
  Filled 2015-04-05 (×3): qty 1

## 2015-04-05 MED ORDER — SODIUM CHLORIDE 0.9 % IV SOLN
10000.0000 ug | INTRAVENOUS | Status: DC | PRN
  Administered 2015-04-05: 25 ug/min via INTRAVENOUS

## 2015-04-05 MED ORDER — METOPROLOL TARTRATE 1 MG/ML IV SOLN: 2.0000 mg | INTRAVENOUS | Status: DC | PRN

## 2015-04-05 MED ORDER — DEXAMETHASONE SODIUM PHOSPHATE 4 MG/ML IJ SOLN
INTRAMUSCULAR | Status: DC | PRN
  Administered 2015-04-05: 5 mg via INTRAVENOUS

## 2015-04-05 MED ORDER — LOSARTAN POTASSIUM-HCTZ 50-12.5 MG PO TABS: 1.0000 | ORAL_TABLET | Freq: Every day | ORAL | Status: DC

## 2015-04-05 MED ORDER — SIMVASTATIN 20 MG PO TABS
20.0000 mg | ORAL_TABLET | Freq: Every day | ORAL | Status: DC
  Administered 2015-04-05 – 2015-04-06 (×2): 20 mg via ORAL
  Filled 2015-04-05 (×2): qty 1

## 2015-04-05 MED ORDER — PANTOPRAZOLE SODIUM 40 MG PO TBEC
40.0000 mg | DELAYED_RELEASE_TABLET | Freq: Every day | ORAL | Status: DC
  Administered 2015-04-06 – 2015-04-07 (×2): 40 mg via ORAL
  Filled 2015-04-05 (×3): qty 1

## 2015-04-05 MED ORDER — ONDANSETRON HCL 4 MG/2ML IJ SOLN
INTRAMUSCULAR | Status: DC | PRN
  Administered 2015-04-05: 4 mg via INTRAVENOUS

## 2015-04-05 MED ORDER — FENTANYL CITRATE (PF) 100 MCG/2ML IJ SOLN
INTRAMUSCULAR | Status: AC
  Administered 2015-04-05: 50 ug via INTRAVENOUS
  Filled 2015-04-05: qty 2

## 2015-04-05 MED ORDER — DOCUSATE SODIUM 100 MG PO CAPS
100.0000 mg | ORAL_CAPSULE | Freq: Every day | ORAL | Status: DC
  Administered 2015-04-06 – 2015-04-07 (×2): 100 mg via ORAL
  Filled 2015-04-05 (×2): qty 1

## 2015-04-05 MED ORDER — PANTOPRAZOLE SODIUM 40 MG IV SOLR
40.0000 mg | Freq: Every day | INTRAVENOUS | Status: DC
  Administered 2015-04-05 – 2015-04-06 (×2): 40 mg via INTRAVENOUS
  Filled 2015-04-05 (×3): qty 40

## 2015-04-05 MED ORDER — OXYCODONE HCL 5 MG PO TABS
5.0000 mg | ORAL_TABLET | ORAL | Status: DC | PRN
  Filled 2015-04-05: qty 1

## 2015-04-05 MED ORDER — GUAIFENESIN-DM 100-10 MG/5ML PO SYRP: 15.0000 mL | ORAL_SOLUTION | ORAL | Status: DC | PRN

## 2015-04-05 MED ORDER — VASOPRESSIN 20 UNIT/ML IV SOLN
INTRAVENOUS | Status: DC | PRN
  Administered 2015-04-05 (×5): 2 [IU] via INTRAVENOUS

## 2015-04-05 MED ORDER — FENTANYL CITRATE (PF) 100 MCG/2ML IJ SOLN
INTRAMUSCULAR | Status: DC | PRN
Start: 2015-04-05 — End: 2015-04-05
  Administered 2015-04-05 (×2): 50 ug via INTRAVENOUS

## 2015-04-05 MED ORDER — MIDAZOLAM HCL 2 MG/2ML IJ SOLN
INTRAMUSCULAR | Status: DC | PRN
  Administered 2015-04-05: 1 mg via INTRAVENOUS

## 2015-04-05 MED ORDER — LIDOCAINE HCL (CARDIAC) 20 MG/ML IV SOLN
INTRAVENOUS | Status: DC | PRN
  Administered 2015-04-05: 50 mg via INTRAVENOUS

## 2015-04-05 MED ORDER — VITAMIN D 1000 UNITS PO TABS
2000.0000 [IU] | ORAL_TABLET | Freq: Every day | ORAL | Status: DC
  Administered 2015-04-06: 2000 [IU] via ORAL
  Filled 2015-04-05: qty 2

## 2015-04-05 MED ORDER — HEPARIN SODIUM (PORCINE) 1000 UNIT/ML IJ SOLN
INTRAMUSCULAR | Status: DC | PRN
  Administered 2015-04-05: 7000 [IU] via INTRAVENOUS

## 2015-04-05 MED ORDER — DOPAMINE-DEXTROSE 3.2-5 MG/ML-% IV SOLN
INTRAVENOUS | Status: AC
  Administered 2015-04-05: 4.031 ug/kg/min via INTRAVENOUS
  Filled 2015-04-05: qty 250

## 2015-04-05 MED ORDER — SUGAMMADEX SODIUM 200 MG/2ML IV SOLN
INTRAVENOUS | Status: DC | PRN
  Administered 2015-04-05: 127 mg via INTRAVENOUS

## 2015-04-05 MED ORDER — LIDOCAINE HCL (PF) 1 % IJ SOLN
INTRAMUSCULAR | Status: AC
  Filled 2015-04-05: qty 30

## 2015-04-05 MED ORDER — HYDROCODONE-ACETAMINOPHEN 5-325 MG PO TABS
1.0000 | ORAL_TABLET | ORAL | Status: DC | PRN
  Administered 2015-04-06: 2 via ORAL
  Administered 2015-04-06 – 2015-04-07 (×3): 1 via ORAL
  Filled 2015-04-05 (×2): qty 1
  Filled 2015-04-05: qty 2
  Filled 2015-04-05: qty 1

## 2015-04-05 MED ORDER — ONDANSETRON HCL 4 MG/2ML IJ SOLN
4.0000 mg | Freq: Once | INTRAMUSCULAR | Status: AC | PRN
  Administered 2015-04-05: 4 mg via INTRAVENOUS

## 2015-04-05 MED ORDER — ONDANSETRON HCL 4 MG/2ML IJ SOLN
INTRAMUSCULAR | Status: AC
  Administered 2015-04-05: 4 mg via INTRAVENOUS
  Filled 2015-04-05: qty 2

## 2015-04-05 MED ORDER — PROPOFOL 10 MG/ML IV BOLUS
INTRAVENOUS | Status: DC | PRN
  Administered 2015-04-05: 170 mg via INTRAVENOUS
  Administered 2015-04-05: 30 mg via INTRAVENOUS

## 2015-04-05 MED ORDER — SERTRALINE HCL 50 MG PO TABS
50.0000 mg | ORAL_TABLET | Freq: Every day | ORAL | Status: DC
  Administered 2015-04-05 – 2015-04-06 (×2): 50 mg via ORAL
  Filled 2015-04-05 (×2): qty 1

## 2015-04-05 MED ORDER — EPHEDRINE SULFATE 50 MG/ML IJ SOLN
INTRAMUSCULAR | Status: DC | PRN
  Administered 2015-04-05: 5 mg via INTRAVENOUS
  Administered 2015-04-05 (×2): 10 mg via INTRAVENOUS
  Administered 2015-04-05: 5 mg via INTRAVENOUS

## 2015-04-05 MED ORDER — HYDRALAZINE HCL 20 MG/ML IJ SOLN
5.0000 mg | INTRAMUSCULAR | Status: DC | PRN
  Administered 2015-04-05: 5 mg via INTRAVENOUS
  Filled 2015-04-05: qty 1

## 2015-04-05 SURGICAL SUPPLY — 63 items
APPLIER CLIP 11 MED OPEN (CLIP)
APPLIER CLIP 9.375 SM OPEN (CLIP)
BAG DECANTER FOR FLEXI CONT (MISCELLANEOUS) ×2 IMPLANT
BLADE SURG 15 STRL LF DISP TIS (BLADE) ×1 IMPLANT
BLADE SURG 15 STRL SS (BLADE) ×1
BLADE SURG SZ11 CARB STEEL (BLADE) ×2 IMPLANT
BOOT SUTURE AID YELLOW STND (SUTURE) ×4 IMPLANT
BRUSH SCRUB 4% CHG (MISCELLANEOUS) ×2 IMPLANT
CANISTER SUCT 1200ML W/VALVE (MISCELLANEOUS) ×2 IMPLANT
CATH TRAY 16F METER LATEX (MISCELLANEOUS) ×2 IMPLANT
CLIP APPLIE 11 MED OPEN (CLIP) IMPLANT
CLIP APPLIE 9.375 SM OPEN (CLIP) IMPLANT
DRAPE INCISE IOBAN 66X45 STRL (DRAPES) ×2 IMPLANT
DRAPE LAPAROTOMY 100X77 ABD (DRAPES) ×2 IMPLANT
DRAPE SHEET LG 3/4 BI-LAMINATE (DRAPES) ×2 IMPLANT
DRESSING SURGICEL FIBRLLR 1X2 (HEMOSTASIS) ×1 IMPLANT
DRSG SURGICEL FIBRILLAR 1X2 (HEMOSTASIS) ×2
DURAPREP 26ML APPLICATOR (WOUND CARE) ×2 IMPLANT
ELECT CAUTERY BLADE 6.4 (BLADE) ×2 IMPLANT
ELECT REM PT RETURN 9FT ADLT (ELECTROSURGICAL) ×2
ELECTRODE REM PT RTRN 9FT ADLT (ELECTROSURGICAL) ×1 IMPLANT
EVICEL 2ML SEALANT HUMAN (Miscellaneous) ×2 IMPLANT
GLOVE BIO SURGEON STRL SZ7 (GLOVE) ×10 IMPLANT
GLOVE INDICATOR 7.5 STRL GRN (GLOVE) ×8 IMPLANT
GLOVE SURG SYN 8.0 (GLOVE) ×2 IMPLANT
GOWN STRL REUS W/ TWL LRG LVL3 (GOWN DISPOSABLE) ×4 IMPLANT
GOWN STRL REUS W/ TWL XL LVL3 (GOWN DISPOSABLE) ×1 IMPLANT
GOWN STRL REUS W/TWL LRG LVL3 (GOWN DISPOSABLE) ×4
GOWN STRL REUS W/TWL XL LVL3 (GOWN DISPOSABLE) ×1
HOLDER FOLEY CATH W/STRAP (MISCELLANEOUS) ×2 IMPLANT
IV NS 500ML (IV SOLUTION) ×1
IV NS 500ML BAXH (IV SOLUTION) ×1 IMPLANT
KIT RM TURNOVER STRD PROC AR (KITS) ×2 IMPLANT
LABEL OR SOLS (LABEL) ×2 IMPLANT
LIQUID BAND (GAUZE/BANDAGES/DRESSINGS) ×2 IMPLANT
LOOP RED MAXI  1X406MM (MISCELLANEOUS) ×2
LOOP VESSEL MAXI 1X406 RED (MISCELLANEOUS) ×2 IMPLANT
LOOP VESSEL MINI 0.8X406 BLUE (MISCELLANEOUS) ×1 IMPLANT
LOOPS BLUE MINI 0.8X406MM (MISCELLANEOUS) ×1
NEEDLE FILTER BLUNT 18X 1/2SAF (NEEDLE) ×1
NEEDLE FILTER BLUNT 18X1 1/2 (NEEDLE) ×1 IMPLANT
NEEDLE HYPO 25X1 1.5 SAFETY (NEEDLE) IMPLANT
NS IRRIG 500ML POUR BTL (IV SOLUTION) ×2 IMPLANT
PACK BASIN MAJOR ARMC (MISCELLANEOUS) ×2 IMPLANT
PATCH CAROTID ECM VASC 1X10 (Prosthesis & Implant Heart) ×2 IMPLANT
PENCIL ELECTRO HAND CTR (MISCELLANEOUS) IMPLANT
SHUNT CAROTID STR REINF 3.0X4. (MISCELLANEOUS) ×2 IMPLANT
SUT MNCRL+ 5-0 UNDYED PC-3 (SUTURE) ×1 IMPLANT
SUT MONOCRYL 5-0 (SUTURE) ×1
SUT PROLENE 6 0 BV (SUTURE) ×22 IMPLANT
SUT PROLENE 7 0 BV 1 (SUTURE) ×12 IMPLANT
SUT SILK 2 0 (SUTURE) ×1
SUT SILK 2-0 18XBRD TIE 12 (SUTURE) ×1 IMPLANT
SUT SILK 3 0 (SUTURE) ×2
SUT SILK 3-0 18XBRD TIE 12 (SUTURE) ×2 IMPLANT
SUT SILK 4 0 (SUTURE) ×1
SUT SILK 4-0 18XBRD TIE 12 (SUTURE) ×1 IMPLANT
SUT VIC AB 3-0 SH 27 (SUTURE) ×2
SUT VIC AB 3-0 SH 27X BRD (SUTURE) ×2 IMPLANT
SYR 20CC LL (SYRINGE) ×2 IMPLANT
SYR 3ML LL SCALE MARK (SYRINGE) ×2 IMPLANT
SYRINGE 10CC LL (SYRINGE) IMPLANT
TUBING CONNECTING 10 (TUBING) IMPLANT

## 2015-04-05 NOTE — Op Note (Signed)
[facility]   OPERATIVE NOTE  PROCEDURE:   1.  Left carotid endarterectomy with CorMatrix arterial patch reconstruction  PRE-OPERATIVE DIAGNOSIS: 1.  Critical carotid stenosis   POST-OPERATIVE DIAGNOSIS: same as above   SURGEON: Renford DillsGregory G Emelin Dascenzo, MD  ASSISTANT(S): Ms. Raul DelKim Stegmayer  ANESTHESIA: general  ESTIMATED BLOOD LOSS: 50 cc  FINDING(S): 1.  Extensive calcified carotid plaque.  SPECIMEN(S):  Carotid plaque (sent to Pathology)  INDICATIONS:   Melanie Mitchell is a 60 y.o. y.o. female who presents with left carotid stenosis of 90 %.  The risks, benefits, and alternatives to carotid endarterectomy were discussed with the patient. The differences between carotid stenting and carotid endarterectomy were reviewed.  The patient voiced understanding and appears to be aware that the risks of carotid endarterectomy include but are not limited to: bleeding, infection, stroke, myocardial infarction, death, cranial nerve injuries both temporary and permanent, neck hematoma, possible airway compromise, labile blood pressure post-operatively, cerebral hyperperfusion syndrome, and possible need for additional interventions in the future. The patient is aware of the risks and agrees to proceed forward with the procedure.  DESCRIPTION: After full informed written consent was obtained from the patient, the patient was brought back to the operating room and placed supine upon the operating table.  Prior to induction, the patient received IV antibiotics.  After obtaining adequate anesthesia, the patient was placed a supine position with a shoulder roll in place and the patient's neck slightly hyperextended and rotated away from the surgical site.  The patient was prepped in the standard fashion for a carotid endarterectomy.    The incision was made anterior to the sternocleidomastoid muscle and dissected down through the subcutaneous tissue.  The platysmas was opened with  electrocautery.  The internal jugular vein and facial vein were identified.  The facial vein is ligated and divided between 2-0 silk ties.  The omohyoid was identified in the common carotid artery exposed at this level. The dissection was there in carried out along the carotid artery in a cranial direction.  The dissection was then carried along periadventitial plane along the common carotid artery up to the bifurcation. The external carotid artery was identified. Vessel loops were then placed around the external carotid artery as well as the superior thyroid artery. In the process of this dissection, the hypoglossal nerve was identified and protected from harm.  The internal carotid artery was then dissected circumferentially just beyond an area in the internal carotid artery distal to the plaque.    At this point, we gave the patient 7000 units of intravenous heparin.  After this was allowed to circulate for several minutes, the common carotid followed by the external carotid and then the internal carotid artery were clamped.  Arteriotomy was made in the common carotid artery with a 11 blade, and extended the arteriotomy with a Potts scissor down into the common carotid artery, then the arteriotomy was carried through the bifurcation into the internal carotid artery until I reached an area that was not diseased.  At this point, a Sundt shunt was placed.  The endarterectomy was begun in the common carotid artery with a Runner, broadcasting/film/videoenfield elevator and carried this dissection down into the common carotid artery circumferentially.  Then I transected the plaque at a segment where it was adherent and transected the plaque with Potts scissors.  I then carried this dissection up into the external carotid artery.  The plaque was extracted by unclamping the external carotid artery and performing an eversion  endarterectomy.  The dissection was then carried into the internal carotid artery where a  feathered end point was  created.  The plaque was passed off the field as a specimen.  The distal endpoint was tacked down with 6 interrupted 7-0 Prolene sutures.  A CorMatrix arterial patch was delivered onto the field and trimmed appropriately for the artery and sewed in place with 6-0 Prolene using a 4 quadrant technique.  The medial suture line was completed and the lateral suture line was run approximately one quarter the length of the arteriotomy.  Prior to completing this patch angioplasty, the shunt was removed, the internal carotid artery was flushed and there was excellent backbleeding.  The carotid artery repair was flushed with heparinized saline and then the patch angioplasty was completed in the usual fashion.  The flow was then reestablished first to the external carotid artery and then the internal carotid artery to prevent distal embolization.   Several minutes of pressure were held and 6-0 Prolene patch sutures were used as need for hemostasis.  At this point, I placed Surgicel and Evicel topical hemostatic agents.  There was no more active bleeding in the surgical site.  The sternocleidomastoid space was closed with three interrupted 3-0 Vicryl sutures. I then reapproximated the platysma muscle with a running stitch of 3-0 Vicryl.  The skin was then closed with a running subcuticular 4-0 Monocryl.  The skin was then cleaned, dried and Dermabond was used to reinforce the skin closure.  The patient awakened and was taken to the recovery room in stable condition, following commands and moving all four extremities without any apparent deficits.    COMPLICATIONS: none  CONDITION: stable  Melanie Mitchell, Melanie Mitchell 04/05/2015<2:16 PM

## 2015-04-05 NOTE — Anesthesia Preprocedure Evaluation (Signed)
Anesthesia Evaluation  Patient identified by MRN, date of birth, ID band Patient awake    Reviewed: Allergy & Precautions, NPO status , Patient's Chart, lab work & pertinent test results  Airway Mallampati: I  TM Distance: >3 FB Neck ROM: Full    Dental  (+) Teeth Intact   Pulmonary Current Smoker,    Pulmonary exam normal        Cardiovascular Exercise Tolerance: Good hypertension, Pt. on medications + Peripheral Vascular Disease  Normal cardiovascular exam Rhythm:Regular Rate:Normal     Neuro/Psych Anxiety Depression    GI/Hepatic GERD  Medicated and Controlled,  Endo/Other    Renal/GU      Musculoskeletal  (+) Arthritis , Osteoarthritis,    Abdominal Normal abdominal exam  (+)   Peds  Hematology   Anesthesia Other Findings   Reproductive/Obstetrics                             Anesthesia Physical Anesthesia Plan  ASA: III  Anesthesia Plan: General   Post-op Pain Management:    Induction: Intravenous  Airway Management Planned: Oral ETT  Additional Equipment: Arterial line  Intra-op Plan:   Post-operative Plan: Extubation in OR  Informed Consent: I have reviewed the patients History and Physical, chart, labs and discussed the procedure including the risks, benefits and alternatives for the proposed anesthesia with the patient or authorized representative who has indicated his/her understanding and acceptance.     Plan Discussed with: CRNA  Anesthesia Plan Comments:         Anesthesia Quick Evaluation

## 2015-04-05 NOTE — Anesthesia Procedure Notes (Signed)
Procedure Name: Intubation Performed by: Kirsten Spearing Pre-anesthesia Checklist: Patient identified, Patient being monitored, Timeout performed, Emergency Drugs available and Suction available Patient Re-evaluated:Patient Re-evaluated prior to inductionOxygen Delivery Method: Circle system utilized Preoxygenation: Pre-oxygenation with 100% oxygen Intubation Type: IV induction Ventilation: Mask ventilation without difficulty Laryngoscope Size: Miller and 2 Grade View: Grade II Tube type: Oral Tube size: 7.0 mm Number of attempts: 1 Airway Equipment and Method: Stylet Placement Confirmation: ETT inserted through vocal cords under direct vision,  positive ETCO2 and breath sounds checked- equal and bilateral Secured at: 21 cm Tube secured with: Tape Dental Injury: Teeth and Oropharynx as per pre-operative assessment      

## 2015-04-05 NOTE — H&P (Signed)
[facility] History & Physical Update  The patient was interviewed and re-examined.  The patient's previous History and Physical has been reviewed and is unchanged.  There is no change in the plan of care. We plan to proceed with the scheduled procedure.  Clatie Kessen, Latina CraverGregory G, MD  04/05/2015, 10:09 AM

## 2015-04-05 NOTE — Transfer of Care (Signed)
Immediate Anesthesia Transfer of Care Note  Patient: Melanie LawlessJoan D Mitchell  Procedure(s) Performed: Procedure(s): ENDARTERECTOMY CAROTID (Left)  Patient Location: PACU  Anesthesia Type:General  Level of Consciousness: awake and alert   Airway & Oxygen Therapy: Patient Spontanous Breathing and Patient connected to face mask oxygen  Post-op Assessment: Report given to RN, Patient moving all extremities X 4 and Patient able to stick tongue midline  Post vital signs: Reviewed  Last Vitals:  Filed Vitals:   04/05/15 1411 04/05/15 1415  BP: 95/67 79/57  Pulse: 78 76  Temp: 36.8 C 36.8 C  Resp: 15 15    Complications: No apparent anesthesia complications

## 2015-04-05 NOTE — Anesthesia Postprocedure Evaluation (Signed)
Anesthesia Post Note  Patient: Melanie LawlessJoan D Mitchell  Procedure(s) Performed: Procedure(s) (LRB): ENDARTERECTOMY CAROTID (Left)  Patient location during evaluation: PACU Anesthesia Type: General Level of consciousness: awake Pain management: pain level controlled Vital Signs Assessment: post-procedure vital signs reviewed and stable Respiratory status: spontaneous breathing Cardiovascular status: blood pressure returned to baseline Postop Assessment: no headache Anesthetic complications: no    Last Vitals:  Filed Vitals:   04/05/15 1520 04/05/15 1528  BP: 94/53   Pulse: 80 66  Temp:    Resp: 22 19    Last Pain:  Filed Vitals:   04/05/15 1529  PainSc: Asleep                 Larose Batres M

## 2015-04-06 LAB — BASIC METABOLIC PANEL
Anion gap: 5 (ref 5–15)
BUN: 9 mg/dL (ref 6–20)
CHLORIDE: 108 mmol/L (ref 101–111)
CO2: 26 mmol/L (ref 22–32)
Calcium: 8.1 mg/dL — ABNORMAL LOW (ref 8.9–10.3)
Creatinine, Ser: 0.77 mg/dL (ref 0.44–1.00)
GFR calc non Af Amer: >60 mL/min (ref >60)
Glucose, Bld: 127 mg/dL — ABNORMAL HIGH (ref 65–99)
POTASSIUM: 3.4 mmol/L — AB (ref 3.5–5.1)
SODIUM: 139 mmol/L (ref 135–145)

## 2015-04-06 LAB — CBC
HCT: 32.9 % — ABNORMAL LOW (ref 35.0–47.0)
HEMOGLOBIN: 11.1 g/dL — AB (ref 12.0–16.0)
MCH: 30.8 pg (ref 26.0–34.0)
MCHC: 33.5 g/dL (ref 32.0–36.0)
MCV: 91.7 fL (ref 80.0–100.0)
Platelets: 187 10*3/uL (ref 150–440)
RBC: 3.59 MIL/uL — AB (ref 3.80–5.20)
RDW: 13.3 % (ref 11.5–14.5)
WBC: 8.3 10*3/uL (ref 3.6–11.0)

## 2015-04-06 NOTE — Progress Notes (Signed)
A line removed and foley removed, pt sat out of bed and up ambulatory around unit. Co pain at times in neck, tearful at times, reassured, and po meds given with relief, pt is currently sleeping

## 2015-04-06 NOTE — Progress Notes (Signed)
BP is 117/62 , pt co pain re mediated po, still has not voided, MD informed and new orders noted, to transfer to floor in stable condition

## 2015-04-06 NOTE — Progress Notes (Signed)
Pt has slept all afternoon, encouraged to sit out of bed for supper, ambulated all around unit, ambulates well, gait is slightly unsteady, but pt states this is normal for her as she has an old ankle fx. Has only voided x 2 , about 50 mls, Po fluids encouraged, IV fluids remain at 75 mls. BP improved to 95/ 55 and MD stated this am that pt may move to the floor - Tele needed if VSS. Currently awaiting pt to void

## 2015-04-06 NOTE — Progress Notes (Signed)
1 Day Post-Op  Subjective: Doing OK. Notes left neck incisional pain. Has been intermittently on Dopamine gtt for SBP <80. Currently high 80s off Dopamine gtt  Objective: Vital signs in last 24 hours: Temp:  [96.6 F (35.9 C)-98.6 F (37 C)] 98.4 F (36.9 C) (03/04 0755) Pulse Rate:  [52-90] 77 (03/04 0800) Resp:  [15-31] 18 (03/04 0800) BP: (57-164)/(40-78) 89/65 mmHg (03/04 0800) SpO2:  [94 %-100 %] 95 % (03/04 0800) Arterial Line BP: (38-111)/(31-67) 73/47 mmHg (03/03 1624) Weight:  [63.504 kg (140 lb)-64.5 kg (142 lb 3.2 oz)] 64.5 kg (142 lb 3.2 oz) (03/03 1645) Last BM Date: 04/05/15  Intake/Output from previous day: 03/03 0701 - 03/04 0700 In: 3430.2 [I.V.:3380.2; IV Piggyback:50] Out: 1250 [Urine:1200; Blood:50] Intake/Output this shift: Total I/O In: -  Out: 25 [Urine:25]  Gen: Alert, NAD Neuro: CN II-XII intact Left neck incision- C/D/I, soft, no hematoma CV: RR Lungs: CTA   Lab Results:   Recent Labs  04/06/15 0613  WBC 8.3  HGB 11.1*  HCT 32.9*  PLT 187   BMET  Recent Labs  04/06/15 0613  NA 139  K 3.4*  CL 108  CO2 26  GLUCOSE 127*  BUN 9  CREATININE 0.77  CALCIUM 8.1*   PT/INR No results for input(s): LABPROT, INR in the last 72 hours. ABG No results for input(s): PHART, HCO3 in the last 72 hours.  Invalid input(s): PCO2, PO2  Studies/Results: No results found.  Anti-infectives: Anti-infectives    Start     Dose/Rate Route Frequency Ordered Stop   04/05/15 1930  clindamycin (CLEOCIN) IVPB 300 mg     300 mg 100 mL/hr over 30 Minutes Intravenous 3 times per day 04/05/15 1415 04/06/15 0621   04/05/15 0934  clindamycin (CLEOCIN) 300 MG/50ML IVPB    Comments:  LEWIS, CINDY: cabinet override      04/05/15 0934 04/05/15 1149   04/05/15 0145  clindamycin (CLEOCIN) IVPB 300 mg  Status:  Discontinued     300 mg 100 mL/hr over 30 Minutes Intravenous  Once 04/05/15 0142 04/05/15 1414      Assessment/Plan: s/p  Procedure(s): ENDARTERECTOMY CAROTID (Left)  Will keep in ICU for now with fluctuating SBP D/C IVF as tolerated Probable D/C home tomorrow   LOS: 1 day    Eli Hosesco, Eward Rutigliano A 04/06/2015

## 2015-04-07 MED ORDER — HYDROCODONE-ACETAMINOPHEN 5-325 MG PO TABS: 1.0000 | ORAL_TABLET | ORAL | Status: DC | PRN

## 2015-04-07 MED ORDER — ASPIRIN 81 MG PO TBEC: 81.0000 mg | DELAYED_RELEASE_TABLET | Freq: Every day | ORAL | Status: AC

## 2015-04-07 NOTE — Discharge Instructions (Signed)
Please call/return to ER for increased pain, redness, swelling or bleeding from incision. Also any slurring of speech, weakness in extremities or any neurologic symptoms.

## 2015-04-07 NOTE — Discharge Summary (Signed)
Physician Discharge Summary  Patient ID: Melanie LawlessJoan D Merlo MRN: [id number] DOB/AGE: [birth year] 61 y.o.  Admit date: 04/05/2015 Discharge date: 04/07/2015  Admission Diagnoses: Carotid Stenosis  Discharge Diagnoses:  Active Problems:   Carotid stenosis   Discharged Condition: Good  Hospital Course: Admitted for an elective left Carotid endarterectomy. Perioperatively she remained neurologically stable however she required pressors for hypotension. On postoperative day 1 she was able to be weaned off pressors and remained stable. On postoperative day 2 she was discharged home.  Consults: none  Significant Diagnostic Studies:none  Treatments: none  Discharge Exam: Blood pressure 121/71, pulse 65, temperature 99.5 F (37.5 C), temperature source Oral, resp. rate 23, height 5\' 7"  (1.702 m), weight 64.5 kg (142 lb 3.2 oz), SpO2 95 %.  Gen: alert, NAD  Neuro: CN II-XII intact, strength equal throughout Left neck incision, C/D/I, soft, no hematoma  Disposition: Home     Medication List    ASK your doctor about these medications        clonazePAM 1 MG tablet  Commonly known as:  KLONOPIN  Take 1 tablet (1 mg total) by mouth 2 (two) times daily as needed for anxiety.     HYDROcodone-acetaminophen 5-500 MG tablet  Commonly known as:  VICODIN  Take by mouth. Reported on 04/05/2015     losartan-hydrochlorothiazide 50-12.5 MG tablet  Commonly known as:  HYZAAR  Take by mouth.     omeprazole 20 MG capsule  Commonly known as:  PRILOSEC  Take by mouth.     sertraline 50 MG tablet  Commonly known as:  ZOLOFT  Take by mouth.     simvastatin 40 MG tablet  Commonly known as:  ZOCOR  Take by mouth.     Vitamin D 2000 units tablet  Take by mouth.           Follow-up Information    Follow up with Schnier, Latina CraverGregory G, MD In 10 days.   Specialties:  Vascular Surgery, Cardiology, Radiology, Vascular Surgery   Contact information:   [address] [place]  [id number] [phone]       Signed: Bertram Denversco, Adaleena Mooers A 04/07/2015, 8:26 AM

## 2015-04-07 NOTE — Progress Notes (Signed)
Patient with order to discharged.  RN reviewed discharge information with patient with no further questions.  Prescription and patient's belonging given to patient.  Patient discharged via wheelchair accompanied by friend and orderly.

## 2015-04-07 NOTE — Progress Notes (Signed)
VSS, Pt up to bedside to void several times, c/o pain x1, slept most of the night.

## 2015-04-08 ENCOUNTER — Encounter: Payer: Self-pay | Admitting: Vascular Surgery

## 2015-04-09 LAB — SURGICAL PATHOLOGY

## 2015-04-10 LAB — GLUCOSE, CAPILLARY: Glucose-Capillary: 143 mg/dL — ABNORMAL HIGH (ref 65–99)

## 2015-04-17 ENCOUNTER — Other Ambulatory Visit: Payer: Self-pay | Admitting: Family Medicine

## 2015-04-17 NOTE — Telephone Encounter (Signed)
Pt contacted office for refill request on the following medications:  Melanie Mitchell.  CB#[phone]/MW  clonazePAM (KLONOPIN) 1 MG tablet  losartan-hydrochlorothiazide (HYZAAR) 50-12.5 MG per tablet

## 2015-04-18 MED ORDER — LOSARTAN POTASSIUM-HCTZ 50-12.5 MG PO TABS: 1.0000 | ORAL_TABLET | Freq: Every day | ORAL | Status: DC

## 2015-04-18 MED ORDER — CLONAZEPAM 1 MG PO TABS: 1.0000 mg | ORAL_TABLET | Freq: Two times a day (BID) | ORAL | Status: DC | PRN

## 2015-04-18 NOTE — Telephone Encounter (Signed)
Please review-aa 

## 2015-04-18 NOTE — Telephone Encounter (Signed)
1 year on the blood pressure medicine 3 months on the lorazepam.

## 2015-04-18 NOTE — Telephone Encounter (Signed)
RXs refilled-aa 

## 2015-04-22 ENCOUNTER — Ambulatory Visit: Payer: BLUE CROSS/BLUE SHIELD | Admitting: Family Medicine

## 2015-05-27 ENCOUNTER — Ambulatory Visit (INDEPENDENT_AMBULATORY_CARE_PROVIDER_SITE_OTHER): Payer: BLUE CROSS/BLUE SHIELD | Admitting: Family Medicine

## 2015-05-27 VITALS — BP 130/60 | HR 90 | Temp 97.6°F | Resp 16 | Wt 136.0 lb

## 2015-05-27 DIAGNOSIS — F32A Depression, unspecified: Secondary | ICD-10-CM

## 2015-05-27 DIAGNOSIS — F329 Major depressive disorder, single episode, unspecified: Secondary | ICD-10-CM

## 2015-05-27 DIAGNOSIS — Z720 Tobacco use: Secondary | ICD-10-CM

## 2015-05-27 DIAGNOSIS — I10 Essential (primary) hypertension: Secondary | ICD-10-CM | POA: Diagnosis not present

## 2015-05-27 MED ORDER — NICOTINE 21 MG/24HR TD PT24: 21.0000 mg | MEDICATED_PATCH | Freq: Every day | TRANSDERMAL | Status: DC

## 2015-05-27 MED ORDER — BUPROPION HCL ER (XL) 150 MG PO TB24: 150.0000 mg | ORAL_TABLET | Freq: Every day | ORAL | Status: DC

## 2015-05-27 MED ORDER — NICOTINE 14 MG/24HR TD PT24: 14.0000 mg | MEDICATED_PATCH | Freq: Every day | TRANSDERMAL | Status: DC

## 2015-05-27 NOTE — Progress Notes (Signed)
Patient ID: Melanie Mitchell, female   DOB: July 04, 1955, [age] y.o.   MRN: [id number]   Melanie Mitchell  MRN: [id number] DOB: July 04, 1955  Subjective:  HPI   1. Essential hypertension The patient is a 60 year old female who presents for follow up of her hypertension.  She has not been checking her blood pressure outside of the office.    2. Depression The patient is also here for her depression.  She states that she does not feel like she is any better.  She states that she still cries every night.     Patient Active Problem List   Diagnosis Date Noted  . Carotid stenosis 04/05/2015  . Atypical chest pain 03/08/2015  . Carotid artery disease (HCC) 03/08/2015  . Combined fat and carbohydrate induced hyperlipemia 03/08/2015  . Abnormal LFTs 07/20/2014  . Alcohol drinker (HCC) 07/20/2014  . Anxiety 07/20/2014  . Arthritis 07/20/2014  . Pneumonia, organism unspecified 07/20/2014  . Clinical depression 07/20/2014  . Acid reflux 07/20/2014  . HLD (hyperlipidemia) 07/20/2014  . BP (high blood pressure) 07/20/2014  . Hemorrhoid 07/20/2014  . Avitaminosis D 07/20/2014  . Panic attack 07/20/2014  . Anxiety disorder 07/20/2014  . Essential (primary) hypertension 07/20/2014  . Other specified health status 07/20/2014    Past Medical History  Diagnosis Date  . Vitamin D deficiency   . Hyperlipidemia   . Depression   . Anxiety   . Hypertension   . GERD (gastroesophageal reflux disease)   . Hemorrhoids   . Arthritis   . Abnormal LFTs     (Pt told to cut back on alcohol and tylenol.)    Social History   Social History  . Marital Status: Single    Spouse Name: N/A  . Number of Children: N/A  . Years of Education: N/A   Occupational History  . Not on file.   Social History Main Topics  . Smoking status: Current Every Day Smoker -- 1.00 packs/day for 30 years    Types: Cigarettes  . Smokeless tobacco: Never Used  . Alcohol Use: Yes     Comment: drinks 3 to 4 beers 2 to 3  times a week.  . Drug Use: No  . Sexual Activity: No   Other Topics Concern  . Not on file   Social History Narrative    Outpatient Prescriptions Prior to Visit  Medication Sig Dispense Refill  . aspirin EC 81 MG EC tablet Take 1 tablet (81 mg total) by mouth daily. 60 tablet 1  . Cholecalciferol (VITAMIN D) 2000 UNITS tablet Take by mouth.    . clonazePAM (KLONOPIN) 1 MG tablet Take 1 tablet (1 mg total) by mouth 2 (two) times daily as needed for anxiety. 60 tablet 2  . HYDROcodone-acetaminophen (NORCO/VICODIN) 5-325 MG tablet Take 1-2 tablets by mouth every 4 (four) hours as needed for moderate pain or severe pain. 30 tablet 0  . losartan-hydrochlorothiazide (HYZAAR) 50-12.5 MG tablet Take 1 tablet by mouth daily. 30 tablet 12  . omeprazole (PRILOSEC) 20 MG capsule Take by mouth.    . sertraline (ZOLOFT) 50 MG tablet Take by mouth.    . simvastatin (ZOCOR) 40 MG tablet Take by mouth.     No facility-administered medications prior to visit.    Allergies  Allergen Reactions  . Atorvastatin Diarrhea  . Oxycodone Nausea And Vomiting    Mental changes  . Penicillins Other (See Comments)    Review of Systems  Constitutional: Positive for malaise/fatigue. Negative for  fever.  Eyes: Negative.   Respiratory: Negative for cough, shortness of breath and wheezing.   Cardiovascular: Negative for chest pain, palpitations, orthopnea, claudication and leg swelling.  Gastrointestinal: Negative.   Neurological: Negative for dizziness, weakness and headaches.  Psychiatric/Behavioral: Positive for depression. Negative for suicidal ideas, hallucinations, memory loss and substance abuse. The patient is nervous/anxious. The patient does not have insomnia.    Objective:  BP 130/60 mmHg  Pulse 90  Temp(Src) 97.6 F (36.4 C) (Oral)  Resp 16  Wt 136 lb (61.689 kg)  Physical Exam  Constitutional: She is well-developed, well-nourished, and in no distress.  HENT:  Head: Normocephalic and  atraumatic.  Right Ear: External ear normal.  Left Ear: External ear normal.  Nose: Nose normal.  Eyes: Conjunctivae are normal.  Neck: Neck supple.  Scar from left carotid endarterectomy  Cardiovascular: Normal rate, regular rhythm and normal heart sounds.   Pulmonary/Chest: Effort normal and breath sounds normal.  Skin: Skin is warm and dry.  Psychiatric: Mood, memory, affect and judgment normal.    Assessment and Plan :   1. Essential hypertension Controlled.  2. Depression Return to clinic 1-2 months. Will add Wellbutrin to her Sertraline.  Have instructed the patient to take the Sertraline.  Follow up in 1-2 months and we will do a PHQ 9 at that time - buPROPion (WELLBUTRIN XL) 150 MG 24 hr tablet; Take 1 tablet (150 mg total) by mouth daily.  Dispense: 30 tablet; Refill: 12  3. Tobacco abuse Adding Wellbutrin for the depression might also help her stop smoking. - nicotine (NICODERM CQ - DOSED IN MG/24 HOURS) 21 mg/24hr patch; Place 1 patch (21 mg total) onto the skin daily.  Dispense: 28 patch; Refill: 0 - nicotine (NICODERM CQ - DOSED IN MG/24 HOURS) 14 mg/24hr patch; Place 1 patch (14 mg total) onto the skin daily.  Dispense: 28 patch; Refill: 5 4. Status post left carotid endarterectomy Follow-up on lipids on next visit. Done earlier this year by Dr. Reino Kent. She is strongly advised to quit smoking. Right carotid 50% stenosed. 5. Chronic anxiety I have done the exam and reviewed the above chart and it is accurate to the best of my knowledge.  Julieanne Manson MD [facility] 05/27/2015 2:53 PM

## 2015-06-27 ENCOUNTER — Encounter: Payer: Self-pay | Admitting: Vascular Surgery

## 2015-07-19 ENCOUNTER — Other Ambulatory Visit: Payer: Self-pay | Admitting: Family Medicine

## 2015-07-19 NOTE — Telephone Encounter (Signed)
Pt would like refills of clonazePAM (KLONOPIN) 1 MG tablet and omeprazole (PRILOSEC) 20 MG capsule  Sent to [facility].

## 2015-07-19 NOTE — Telephone Encounter (Signed)
Please review-aa 

## 2015-07-22 ENCOUNTER — Other Ambulatory Visit: Payer: Self-pay | Admitting: Family Medicine

## 2015-07-22 MED ORDER — OMEPRAZOLE 20 MG PO CPDR: 20.0000 mg | DELAYED_RELEASE_CAPSULE | Freq: Every day | ORAL | Status: DC

## 2015-07-22 MED ORDER — CLONAZEPAM 1 MG PO TABS: 1.0000 mg | ORAL_TABLET | Freq: Two times a day (BID) | ORAL | Status: DC | PRN

## 2015-07-22 NOTE — Telephone Encounter (Signed)
Pt has called back again . She said she has checked with Melanie Mitchell and they have not recd. Anything yet.  Please advise

## 2015-07-22 NOTE — Telephone Encounter (Signed)
Pt called back regarding her refills.  Please advise.  Would like them today if possible

## 2015-07-22 NOTE — Telephone Encounter (Signed)
Please review-aa 

## 2015-07-22 NOTE — Telephone Encounter (Signed)
Ok to rf for 6 months--thx

## 2015-07-22 NOTE — Telephone Encounter (Signed)
RXs called in, -aa

## 2015-07-29 ENCOUNTER — Ambulatory Visit (INDEPENDENT_AMBULATORY_CARE_PROVIDER_SITE_OTHER): Payer: BLUE CROSS/BLUE SHIELD | Admitting: Family Medicine

## 2015-07-29 VITALS — BP 124/78 | HR 76 | Temp 98.1°F | Resp 16 | Wt 138.0 lb

## 2015-07-29 DIAGNOSIS — E039 Hypothyroidism, unspecified: Secondary | ICD-10-CM

## 2015-07-29 DIAGNOSIS — E7800 Pure hypercholesterolemia, unspecified: Secondary | ICD-10-CM

## 2015-07-29 DIAGNOSIS — I10 Essential (primary) hypertension: Secondary | ICD-10-CM | POA: Diagnosis not present

## 2015-07-29 NOTE — Progress Notes (Signed)
Melanie LawlessJoan D Mitchell  MRN: [id number] DOB: Oct 13, 1955  Subjective:  HPI   The patient is a 60 year old female who presents for follow up of depression.  She was last seen on 05/27/15.  At that time she was started on Wellbutrin.  She states she was unable to take the medicine as it caused her to be more anxious and nervous.    She is also due to have her lipids checked.   She has not yet started the nicotine patches.  Patient states she is down to 5 cigarettes per day and she drinks about 3 drinks on Friday and Saturday. She has no symptoms of any vascular disease after a successful left carotid endarterectomy for asymptomatic left carotid bruit.  Patient Active Problem List   Diagnosis Date Noted  . Carotid stenosis 04/05/2015  . Atypical chest pain 03/08/2015  . Carotid artery disease (HCC) 03/08/2015  . Combined fat and carbohydrate induced hyperlipemia 03/08/2015  . Abnormal LFTs 07/20/2014  . Alcohol drinker (HCC) 07/20/2014  . Anxiety 07/20/2014  . Arthritis 07/20/2014  . Pneumonia, organism unspecified 07/20/2014  . Clinical depression 07/20/2014  . Acid reflux 07/20/2014  . HLD (hyperlipidemia) 07/20/2014  . BP (high blood pressure) 07/20/2014  . Hemorrhoid 07/20/2014  . Avitaminosis D 07/20/2014  . Panic attack 07/20/2014  . Anxiety disorder 07/20/2014  . Essential (primary) hypertension 07/20/2014  . Other specified health status 07/20/2014    Past Medical History  Diagnosis Date  . Vitamin D deficiency   . Hyperlipidemia   . Depression   . Anxiety   . Hypertension   . GERD (gastroesophageal reflux disease)   . Hemorrhoids   . Arthritis   . Abnormal LFTs     (Pt told to cut back on alcohol and tylenol.)    Social History   Social History  . Marital Status: Single    Spouse Name: N/A  . Number of Children: N/A  . Years of Education: N/A   Occupational History  . Not on file.   Social History Main Topics  . Smoking status: Current Every Day Smoker  -- 1.00 packs/day for 30 years    Types: Cigarettes  . Smokeless tobacco: Never Used  . Alcohol Use: Yes     Comment: drinks 3 to 4 beers 2 to 3 times a week.  . Drug Use: No  . Sexual Activity: No   Other Topics Concern  . Not on file   Social History Narrative    Outpatient Prescriptions Prior to Visit  Medication Sig Dispense Refill  . aspirin EC 81 MG EC tablet Take 1 tablet (81 mg total) by mouth daily. 60 tablet 1  . Cholecalciferol (VITAMIN D) 2000 UNITS tablet Take by mouth.    . clonazePAM (KLONOPIN) 1 MG tablet Take 1 tablet (1 mg total) by mouth 2 (two) times daily as needed for anxiety. 60 tablet 5  . losartan-hydrochlorothiazide (HYZAAR) 50-12.5 MG tablet Take 1 tablet by mouth daily. 30 tablet 12  . nicotine (NICODERM CQ - DOSED IN MG/24 HOURS) 14 mg/24hr patch Place 1 patch (14 mg total) onto the skin daily. 28 patch 5  . nicotine (NICODERM CQ - DOSED IN MG/24 HOURS) 21 mg/24hr patch Place 1 patch (21 mg total) onto the skin daily. 28 patch 0  . omeprazole (PRILOSEC) 20 MG capsule Take 1 capsule (20 mg total) by mouth daily. 30 capsule 12  . sertraline (ZOLOFT) 50 MG tablet TAKE ONE (1) TABLET EACH DAY 30 tablet  12  . simvastatin (ZOCOR) 40 MG tablet TAKE ONE (1) TABLET EACH DAY 30 tablet 12  . buPROPion (WELLBUTRIN XL) 150 MG 24 hr tablet Take 1 tablet (150 mg total) by mouth daily. (Patient not taking: Reported on 07/29/2015) 30 tablet 12  . HYDROcodone-acetaminophen (NORCO/VICODIN) 5-325 MG tablet Take 1-2 tablets by mouth every 4 (four) hours as needed for moderate pain or severe pain. (Patient not taking: Reported on 07/29/2015) 30 tablet 0   No facility-administered medications prior to visit.    Allergies  Allergen Reactions  . Atorvastatin Diarrhea  . Oxycodone Nausea And Vomiting    Mental changes  . Penicillins Other (See Comments)    Review of Systems  Constitutional: Positive for malaise/fatigue. Negative for fever.  Respiratory: Negative for cough,  shortness of breath and wheezing.   Cardiovascular: Negative for chest pain, palpitations, orthopnea, claudication and leg swelling.  Gastrointestinal: Nausea: on Wellbutrin.  Neurological: Negative for dizziness, weakness and headaches.  Psychiatric/Behavioral: Positive for depression. Negative for suicidal ideas, hallucinations, memory loss and substance abuse. The patient is nervous/anxious. The patient does not have insomnia.    Objective:  BP 124/78 mmHg  Pulse 76  Temp(Src) 98.1 F (36.7 C) (Oral)  Resp 16  Wt 138 lb (62.596 kg)  Physical Exam  Constitutional: She is oriented to person, place, and time and well-developed, well-nourished, and in no distress.  HENT:  Head: Normocephalic and atraumatic.  Right Ear: External ear normal.  Left Ear: External ear normal.  Nose: Nose normal.  Eyes: Conjunctivae are normal. Pupils are equal, round, and reactive to light.  Neck: Normal range of motion.  Scar on the left side of the anterior neck from carotid endarterectomy  Cardiovascular: Normal rate, regular rhythm and normal heart sounds.   Pulmonary/Chest: Effort normal and breath sounds normal.  Abdominal: Soft.  Neurological: She is alert and oriented to person, place, and time.  Skin: Skin is warm and dry.    Assessment and Plan :   1. Hypothyroidism, unspecified hypothyroidism type  - TSH  2. Essential hypertension  - Comprehensive metabolic panel  3. Hypercholesterolemia  - Lipid Panel With LDL/HDL Ratio 4. ASCVD 5. Status post left carotid endarterectomy 6. Chronic anxiety and depression  More than 50% of visit is spent in discussing issues. She is advised to quit smoking completely but also to quit drinking. She is mourning the death of her mother is year ago and I think she will do fine. I have done the exam and reviewed the above chart and it is accurate to the best of my knowledge.  Julieanne Mansonichard Gilbert MD [facility] 07/29/2015 2:46 PM

## 2015-10-29 ENCOUNTER — Ambulatory Visit: Payer: BLUE CROSS/BLUE SHIELD | Admitting: Family Medicine

## 2015-11-30 LAB — COMPREHENSIVE METABOLIC PANEL
A/G RATIO: 1.4 (ref 1.2–2.2)
ALBUMIN: 4.2 g/dL (ref 3.6–4.8)
ALT: 12 IU/L (ref 0–32)
AST: 25 IU/L (ref 0–40)
Alkaline Phosphatase: 136 IU/L — ABNORMAL HIGH (ref 39–117)
BUN / CREAT RATIO: 11 — AB (ref 12–28)
BUN: 9 mg/dL (ref 8–27)
Bilirubin Total: 0.4 mg/dL (ref 0.0–1.2)
CO2: 27 mmol/L (ref 18–29)
CREATININE: 0.83 mg/dL (ref 0.57–1.00)
Calcium: 9.6 mg/dL (ref 8.7–10.3)
Chloride: 93 mmol/L — ABNORMAL LOW (ref 96–106)
GFR, EST AFRICAN AMERICAN: 88 mL/min/{1.73_m2} (ref >59)
GFR, EST NON AFRICAN AMERICAN: 76 mL/min/{1.73_m2} (ref >59)
GLOBULIN, TOTAL: 2.9 g/dL (ref 1.5–4.5)
GLUCOSE: 105 mg/dL — AB (ref 65–99)
POTASSIUM: 4 mmol/L (ref 3.5–5.2)
SODIUM: 137 mmol/L (ref 134–144)
TOTAL PROTEIN: 7.1 g/dL (ref 6.0–8.5)

## 2015-11-30 LAB — LIPID PANEL WITH LDL/HDL RATIO
Cholesterol, Total: 197 mg/dL (ref 100–199)
HDL: 52 mg/dL (ref >39)
LDL CALC: 97 mg/dL (ref 0–99)
LDl/HDL Ratio: 1.9 ratio units (ref 0.0–3.2)
Triglycerides: 238 mg/dL — ABNORMAL HIGH (ref 0–149)
VLDL CHOLESTEROL CAL: 48 mg/dL — AB (ref 5–40)

## 2015-11-30 LAB — TSH: TSH: 3.8 u[IU]/mL (ref 0.450–4.500)

## 2015-12-02 ENCOUNTER — Telehealth: Payer: Self-pay

## 2015-12-02 NOTE — Telephone Encounter (Signed)
Patient advised as below.  

## 2015-12-03 ENCOUNTER — Ambulatory Visit (INDEPENDENT_AMBULATORY_CARE_PROVIDER_SITE_OTHER): Payer: BLUE CROSS/BLUE SHIELD | Admitting: Family Medicine

## 2015-12-03 VITALS — BP 142/78 | HR 84 | Temp 97.5°F | Resp 16 | Wt 142.0 lb

## 2015-12-03 DIAGNOSIS — R7989 Other specified abnormal findings of blood chemistry: Secondary | ICD-10-CM

## 2015-12-03 DIAGNOSIS — Z789 Other specified health status: Secondary | ICD-10-CM

## 2015-12-03 DIAGNOSIS — I779 Disorder of arteries and arterioles, unspecified: Secondary | ICD-10-CM | POA: Diagnosis not present

## 2015-12-03 DIAGNOSIS — I10 Essential (primary) hypertension: Secondary | ICD-10-CM | POA: Diagnosis not present

## 2015-12-03 DIAGNOSIS — R945 Abnormal results of liver function studies: Secondary | ICD-10-CM

## 2015-12-03 DIAGNOSIS — I739 Peripheral vascular disease, unspecified: Principal | ICD-10-CM

## 2015-12-03 DIAGNOSIS — Z7289 Other problems related to lifestyle: Secondary | ICD-10-CM

## 2015-12-03 DIAGNOSIS — F330 Major depressive disorder, recurrent, mild: Secondary | ICD-10-CM | POA: Diagnosis not present

## 2015-12-03 DIAGNOSIS — K219 Gastro-esophageal reflux disease without esophagitis: Secondary | ICD-10-CM | POA: Diagnosis not present

## 2015-12-03 NOTE — Progress Notes (Signed)
Melanie LawlessJoan D Mitchell  MRN: [id number] DOB: [birth year]  Subjective:  HPI  The patient is a 60 year old female who presents for follow up of her labs and chronic issues.  She is down to 5 cigarettes per day and still drinks he beer.  The patient does not check her blood pressure outside of the office.  She does state that she is has started to go through her mother's things.  She states she still feels numb and still cries at night.  Patient Active Problem List   Diagnosis Date Noted  . Carotid stenosis 04/05/2015  . Atypical chest pain 03/08/2015  . Carotid artery disease (HCC) 03/08/2015  . Combined fat and carbohydrate induced hyperlipemia 03/08/2015  . Abnormal LFTs 07/20/2014  . Alcohol drinker 07/20/2014  . Anxiety 07/20/2014  . Arthritis 07/20/2014  . Pneumonia, organism unspecified(486) 07/20/2014  . Clinical depression 07/20/2014  . Acid reflux 07/20/2014  . HLD (hyperlipidemia) 07/20/2014  . BP (high blood pressure) 07/20/2014  . Hemorrhoid 07/20/2014  . Avitaminosis D 07/20/2014  . Panic attack 07/20/2014  . Anxiety disorder 07/20/2014  . Essential (primary) hypertension 07/20/2014  . Other specified health status 07/20/2014    Past Medical History:  Diagnosis Date  . Abnormal LFTs    (Pt told to cut back on alcohol and tylenol.)  . Anxiety   . Arthritis   . Depression   . GERD (gastroesophageal reflux disease)   . Hemorrhoids   . Hyperlipidemia   . Hypertension   . Vitamin D deficiency     Social History   Social History  . Marital status: Single    Spouse name: N/A  . Number of children: N/A  . Years of education: N/A   Occupational History  . Not on file.   Social History Main Topics  . Smoking status: Current Every Day Smoker    Packs/day: 1.00    Years: 30.00    Types: Cigarettes  . Smokeless tobacco: Never Used  . Alcohol use Yes     Comment: drinks 3 to 4 beers 2 to 3 times a week.  . Drug use: No  . Sexual activity: No   Other Topics  Concern  . Not on file   Social History Narrative  . No narrative on file    Outpatient Encounter Prescriptions as of 12/03/2015  Medication Sig Note  . aspirin EC 81 MG EC tablet Take 1 tablet (81 mg total) by mouth daily.   Marland Kitchen. buPROPion (WELLBUTRIN XL) 150 MG 24 hr tablet Take 1 tablet (150 mg total) by mouth daily.   . Cholecalciferol (VITAMIN D) 2000 UNITS tablet Take by mouth. 07/20/2014: [facility]  . clonazePAM (KLONOPIN) 1 MG tablet Take 1 tablet (1 mg total) by mouth 2 (two) times daily as needed for anxiety.   Marland Kitchen. losartan-hydrochlorothiazide (HYZAAR) 50-12.5 MG tablet Take 1 tablet by mouth daily.   . nicotine (NICODERM CQ - DOSED IN MG/24 HOURS) 14 mg/24hr patch Place 1 patch (14 mg total) onto the skin daily.   . nicotine (NICODERM CQ - DOSED IN MG/24 HOURS) 21 mg/24hr patch Place 1 patch (21 mg total) onto the skin daily.   Marland Kitchen. omeprazole (PRILOSEC) 20 MG capsule Take 1 capsule (20 mg total) by mouth daily.   . sertraline (ZOLOFT) 50 MG tablet TAKE ONE (1) TABLET EACH DAY   . simvastatin (ZOCOR) 40 MG tablet TAKE ONE (1) TABLET EACH DAY   . [DISCONTINUED] HYDROcodone-acetaminophen (NORCO/VICODIN) 5-325 MG tablet  Take 1-2 tablets by mouth every 4 (four) hours as needed for moderate pain or severe pain. (Patient not taking: Reported on 07/29/2015)    No facility-administered encounter medications on file as of 12/03/2015.     Allergies  Allergen Reactions  . Atorvastatin Diarrhea  . Oxycodone Nausea And Vomiting    Mental changes  . Penicillins Other (See Comments)    Review of Systems  Constitutional: Negative for fever and malaise/fatigue.  Respiratory: Negative for cough, shortness of breath and wheezing.   Cardiovascular: Negative for chest pain and palpitations.  Neurological: Negative for dizziness, weakness and headaches.   Objective:  BP (!) 142/78 (BP Location: Right Arm, Patient Position: Sitting, Cuff Size: Normal)   Pulse 84    Temp 97.5 F (36.4 C) (Oral)   Resp 16   Wt 142 lb (64.4 kg)   BMI 22.24 kg/m   Physical Exam  Constitutional: She is oriented to person, place, and time and well-developed, well-nourished, and in no distress.  HENT:  Head: Normocephalic and atraumatic.  Eyes: Pupils are equal, round, and reactive to light.  Neck: Normal range of motion.  Cardiovascular: Normal rate, regular rhythm and normal heart sounds.   Left carotid bruit present over her scar from carotid endarterectomy.  Pulmonary/Chest: Effort normal and breath sounds normal.  Abdominal: Soft.  Neurological: She is alert and oriented to person, place, and time.  Skin: Skin is warm and dry.  Psychiatric: Mood, memory, affect and judgment normal.    Assessment and Plan :  Carotid stenosis/status post left carotid endarterectomy Followed by vascular surgery Tobacco abuse I have strongly stressed the need for the patient to quit smoking. Alcohol abuse The patient thinks it because she "only" drinks beer that this is not a problem with alcohol. She only thinks he can over use alcohol if she drinks liquor. I have stressed to her the need to either stop drinking our limit her consumption to no more than 1-2 beers per day. Depression/chronic anxiety These are under fair control blood platelet the patient self medicates with the beer. If she stops drinking and I am concerned that she will return to the benzodiazepine for the same type. She is appropriately mourning the death of her mother from last year but I think her depression and anxiety have been going on for many years. Or than 50% of this 25 minute visit is spent in counseling. GERD Abnormal liver enzymes Again, she is advised to quit drinking. Hypertension Hyperlipidemia Follow-up labs when appropriate  I have done the exam and reviewed the chart and it is accurate to the best of my knowledge. Julieanne Mansonichard Gilbert M.D. [facility]

## 2015-12-19 ENCOUNTER — Other Ambulatory Visit: Payer: Self-pay

## 2015-12-19 MED ORDER — CLONAZEPAM 1 MG PO TABS: 1.0000 mg | ORAL_TABLET | Freq: Two times a day (BID) | ORAL | 4 refills | Status: DC | PRN

## 2015-12-19 NOTE — Telephone Encounter (Signed)
Refill request from Melanie Mitchell for Clonazepam refill-aa

## 2016-01-16 ENCOUNTER — Telehealth: Payer: Self-pay | Admitting: Family Medicine

## 2016-01-16 NOTE — Telephone Encounter (Signed)
error 

## 2016-01-23 ENCOUNTER — Encounter (INDEPENDENT_AMBULATORY_CARE_PROVIDER_SITE_OTHER): Payer: Self-pay

## 2016-01-23 ENCOUNTER — Encounter (INDEPENDENT_AMBULATORY_CARE_PROVIDER_SITE_OTHER): Payer: BLUE CROSS/BLUE SHIELD

## 2016-01-23 ENCOUNTER — Ambulatory Visit (INDEPENDENT_AMBULATORY_CARE_PROVIDER_SITE_OTHER): Payer: Self-pay | Admitting: Vascular Surgery

## 2016-04-01 ENCOUNTER — Encounter: Payer: BLUE CROSS/BLUE SHIELD | Admitting: Family Medicine

## 2016-05-13 ENCOUNTER — Encounter: Payer: Self-pay | Admitting: Family Medicine

## 2016-05-13 ENCOUNTER — Ambulatory Visit (INDEPENDENT_AMBULATORY_CARE_PROVIDER_SITE_OTHER): Payer: BLUE CROSS/BLUE SHIELD | Admitting: Family Medicine

## 2016-05-13 VITALS — BP 116/68 | HR 80 | Temp 98.0°F | Resp 16 | Ht 68.0 in | Wt 141.0 lb

## 2016-05-13 DIAGNOSIS — I779 Disorder of arteries and arterioles, unspecified: Secondary | ICD-10-CM | POA: Diagnosis not present

## 2016-05-13 DIAGNOSIS — Z0000 Encounter for general adult medical examination without abnormal findings: Secondary | ICD-10-CM | POA: Diagnosis not present

## 2016-05-13 DIAGNOSIS — F330 Major depressive disorder, recurrent, mild: Secondary | ICD-10-CM | POA: Diagnosis not present

## 2016-05-13 DIAGNOSIS — Z1231 Encounter for screening mammogram for malignant neoplasm of breast: Secondary | ICD-10-CM

## 2016-05-13 DIAGNOSIS — F419 Anxiety disorder, unspecified: Secondary | ICD-10-CM

## 2016-05-13 DIAGNOSIS — Z7289 Other problems related to lifestyle: Secondary | ICD-10-CM

## 2016-05-13 DIAGNOSIS — L989 Disorder of the skin and subcutaneous tissue, unspecified: Secondary | ICD-10-CM | POA: Diagnosis not present

## 2016-05-13 DIAGNOSIS — Z789 Other specified health status: Secondary | ICD-10-CM

## 2016-05-13 DIAGNOSIS — F17200 Nicotine dependence, unspecified, uncomplicated: Secondary | ICD-10-CM | POA: Diagnosis not present

## 2016-05-13 DIAGNOSIS — I739 Peripheral vascular disease, unspecified: Secondary | ICD-10-CM

## 2016-05-13 MED ORDER — CLONAZEPAM 1 MG PO TABS: 1.0000 mg | ORAL_TABLET | Freq: Two times a day (BID) | ORAL | 4 refills | Status: DC | PRN

## 2016-05-13 NOTE — Progress Notes (Signed)
Patient: Melanie Mitchell, Female    DOB: 1955-12-31, 61 y.o.   MRN: [id number] Visit Date: 05/13/2016  Today's Provider: Megan Mans, MD   Chief Complaint  Patient presents with  . Annual Exam  . Anxiety   Subjective:    Annual physical exam Melanie Mitchell is a 61 y.o. female who presents today for health maintenance and complete physical. She feels fairly well. Pt is c/o fatigue. She reports exercising never. She reports she is sleeping well.  Last CPE- 02/21/2015 Last pap- 02/21/2015- Normal. HPV Negative. Last mammogram- 08/18/2011- BI-RADS 1. Does not perform self breast exam. Last colonoscopy-  More than 10 years ago. Pt refuses procedure because of financial reasons. Last BMD- osteopenia with OP in femoral neck.  ----------------------------------------------------------------- Anxiety/Depression Pt reports this is worsening. Pt reports she is "overwhelmed". Pt's mother died 2 years ago, and pt is responsible for the home that her mother lived in. The house was built in [place], and pt needs to replace roof. Pt is also paying for a carotid artery surgery she recently had last year. Pt is concerned of her finances.  Pt is taking Zoloft 50 mg po qd, and refuses additional medication. Pt also taking Klonopin 1 mg tablets BID. Pt needs a refill of this medication.   Depression screen PHQ 2/9 05/13/2016  Decreased Interest 1  Down, Depressed, Hopeless 1  PHQ - 2 Score 2  Altered sleeping 3  Tired, decreased energy 1  Change in appetite 1  Feeling bad or failure about yourself  1  Trouble concentrating 0  Moving slowly or fidgety/restless 0  Suicidal thoughts 0  PHQ-9 Score 8  Difficult doing work/chores Somewhat difficult   GAD 7 : Generalized Anxiety Score 05/13/2016  Nervous, Anxious, on Edge 1  Control/stop worrying 1  Worry too much - different things 1  Trouble relaxing 1  Restless 3  Easily annoyed or irritable 1  Afraid - awful might happen 1    Total GAD 7 Score 9  Anxiety Difficulty Very difficult      Review of Systems  Constitutional: Positive for fatigue. Negative for activity change, appetite change, chills, diaphoresis, fever and unexpected weight change.  HENT: Negative.   Eyes: Positive for redness (dryness). Negative for photophobia, pain, discharge, itching and visual disturbance.  Respiratory: Negative.   Cardiovascular: Negative.   Gastrointestinal: Negative.   Endocrine: Negative.   Genitourinary: Negative.   Musculoskeletal: Positive for joint swelling (broken ankle). Negative for arthralgias, back pain, gait problem, myalgias, neck pain and neck stiffness.  Skin: Negative.   Allergic/Immunologic: Negative.   Neurological: Negative.   Hematological: Negative for adenopathy. Bruises/bleeds easily.  Psychiatric/Behavioral: Negative for agitation, behavioral problems, confusion, decreased concentration, dysphoric mood, hallucinations, self-injury, sleep disturbance and suicidal ideas. The patient is nervous/anxious. The patient is not hyperactive.     Social History      She  reports that she has been smoking Cigarettes.  She has a 7.50 pack-year smoking history. She has never used smokeless tobacco. She reports that she drinks alcohol. She reports that she does not use drugs.       Social History   Social History  . Marital status: Single    Spouse name: N/A  . Number of children: 0  . Years of education: some college   Occupational History  .  [employer]   Social History Main Topics  . Smoking status: Current Every Day Smoker    Packs/day: 0.25  Years: 30.00    Types: Cigarettes  . Smokeless tobacco: Never Used  . Alcohol use Yes     Comment: drinks 2 to 3 beers 2 to 3 times a week.  . Drug use: No  . Sexual activity: No   Other Topics Concern  . None   Social History Narrative  . None    Past Medical History:  Diagnosis Date  . Abnormal LFTs    (Pt told to cut back on alcohol and  tylenol.)  . Anxiety   . Arthritis   . Depression   . GERD (gastroesophageal reflux disease)   . Hemorrhoids   . Hyperlipidemia   . Hypertension   . Vitamin D deficiency      Patient Active Problem List   Diagnosis Date Noted  . Carotid stenosis 04/05/2015  . Atypical chest pain 03/08/2015  . Carotid artery disease (HCC) 03/08/2015  . Combined fat and carbohydrate induced hyperlipemia 03/08/2015  . Abnormal LFTs 07/20/2014  . Alcohol drinker 07/20/2014  . Anxiety 07/20/2014  . Arthritis 07/20/2014  . Pneumonia, organism unspecified(486) 07/20/2014  . Clinical depression 07/20/2014  . Acid reflux 07/20/2014  . HLD (hyperlipidemia) 07/20/2014  . BP (high blood pressure) 07/20/2014  . Hemorrhoid 07/20/2014  . Avitaminosis D 07/20/2014  . Panic attack 07/20/2014  . Anxiety disorder 07/20/2014  . Essential (primary) hypertension 07/20/2014  . Other specified health status 07/20/2014    Past Surgical History:  Procedure Laterality Date  . ANKLE FRACTURE SURGERY  2008   steel plate and screws put in  . ENDARTERECTOMY Left 04/05/2015   Procedure: ENDARTERECTOMY CAROTID;  Surgeon: Renford Dills, MD;  Location: [facility] ORS;  Service: Vascular;  Laterality: Left;  . stone in the salitory gland  2004  . TONSILLECTOMY  1960  . 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 Mother Deceased  . Father Deceased at age 65   COPD  . Maternal Aunt Alive  . Maternal Grandmother Deceased   MI  . Maternal Grandfather Deceased   Cirrhosis of the liver and he may have had pancreatitis        Her family history includes Anxiety disorder in her mother; Cancer in her father and mother; Hypercholesterolemia in her mother; Hypertension in her mother; Multiple sclerosis in her maternal aunt.     Allergies  Allergen Reactions  . Atorvastatin Diarrhea  . Oxycodone Nausea And Vomiting    Mental changes  . Penicillins  Other (See Comments)     Current Outpatient Prescriptions:  .  aspirin EC 81 MG EC tablet, Take 1 tablet (81 mg total) by mouth daily., Disp: 60 tablet, Rfl: 1 .  Cholecalciferol (VITAMIN D) 2000 UNITS tablet, Take by mouth., Disp: , Rfl:  .  clonazePAM (KLONOPIN) 1 MG tablet, Take 1 tablet (1 mg total) by mouth 2 (two) times daily as needed for anxiety., Disp: 60 tablet, Rfl: 4 .  losartan-hydrochlorothiazide (HYZAAR) 50-12.5 MG tablet, Take 1 tablet by mouth daily., Disp: 30 tablet, Rfl: 12 .  omeprazole (PRILOSEC) 20 MG capsule, Take 1 capsule (20 mg total) by mouth daily., Disp: 30 capsule, Rfl: 12 .  sertraline (ZOLOFT) 50 MG tablet, TAKE ONE (1) TABLET EACH DAY, Disp: 30 tablet, Rfl: 12 .  simvastatin (ZOCOR) 40 MG tablet, TAKE ONE (1) TABLET EACH DAY, Disp: 30 tablet, Rfl: 12   Patient Care Team: Richard L  Wendelyn Breslow., MD as PCP - General (Family Medicine)      Objective:   Vitals: BP 116/68 (BP Location: Right Arm, Patient Position: Sitting, Cuff Size: Normal)   Pulse 80   Temp 98 F (36.7 C) (Oral)   Resp 16   Ht  (1.727 m)   Wt 141 lb (64 kg)   BMI 21.44 kg/m    Vitals:   05/13/16 1014  BP: 116/68  Pulse: 80  Resp: 16  Temp: 98 F (36.7 C)  TempSrc: Oral  Weight: 141 lb (64 kg)  Height:  (1.727 m)     Physical Exam  Constitutional: She is oriented to person, place, and time. She appears well-developed and well-nourished.  HENT:  Head: Normocephalic and atraumatic.  Right Ear: Tympanic membrane, external ear and ear canal normal.  Left Ear: Tympanic membrane, external ear and ear canal normal.  Nose: Nose normal.  Mouth/Throat: Uvula is midline, oropharynx is clear and moist and mucous membranes are normal.  Eyes: Conjunctivae, EOM and lids are normal. Pupils are equal, round, and reactive to light.  Neck: Trachea normal and normal range of motion. Neck supple. Carotid bruit is not present. No thyroid mass and no thyromegaly present.    Cardiovascular: Normal rate, regular rhythm and normal heart sounds.   Pulmonary/Chest: Effort normal and breath sounds normal.  Abdominal: Soft. Normal appearance and bowel sounds are normal. There is no hepatosplenomegaly. There is no tenderness.  Genitourinary: No breast swelling, tenderness or discharge.  Genitourinary Comments: Skin lesion located at 5:00 position on right breast.  Musculoskeletal: Normal range of motion.  Lymphadenopathy:    She has no cervical adenopathy.    She has no axillary adenopathy.  Neurological: She is alert and oriented to person, place, and time. She has normal strength. No cranial nerve deficit.  Skin: Skin is warm, dry and intact.  Psychiatric: She has a normal mood and affect. Her speech is normal and behavior is normal. Judgment and thought content normal. Cognition and memory are normal.  Pt tearful during visit, speaks openly about problems.     Depression Screen PHQ 2/9 Scores 05/13/2016 07/29/2015 02/21/2015 02/21/2015  PHQ - 2 Score PHQ- 9 Score -      Assessment & Plan:     Routine Health Maintenance and Physical Exam  Exercise Activities and Dietary recommendations Goals    None      Immunization History  Administered Date(s) Administered  . Influenza Whole 11/12/2015  . Influenza,inj,Quad PF,36+ Mos 11/21/2014  . Pneumococcal Polysaccharide-23 08/15/2012  . Tdap 08/15/2012    Health Maintenance  Topic Date Due  . Hepatitis C Screening  10/06/1955  . HIV Screening  11/22/1969  . COLONOSCOPY  11/22/2004  . MAMMOGRAM  08/17/2013  . INFLUENZA VACCINE  09/02/2016  . PAP SMEAR  02/20/2018  . TETANUS/TDAP  08/16/2022     Discussed health benefits of physical activity, and encouraged her to engage in regular exercise appropriate for her age and condition.    -------------------------------------------------------------------- 1. Annual physical exam Stable. FU 6 months for chronic problems.  2. Anxiety  disorder, unspecified type Pt struggling with mother's death and finances. Will refill klonopin as below. - clonazePAM (KLONOPIN) 1 MG tablet; Take 1 tablet (1 mg total) by mouth 2 (two) times daily as needed for anxiety.  Dispense: 60 tablet; Refill: 4  3. Mild episode of recurrent major depressive disorder (HCC) Stable. Continue Zoloft. Call if sx worsen.  4. Compulsive tobacco user syndrome Provided information and education on tobacco cessation. Pt does not seem ready to quit smoking.  5. Left-sided carotid artery disease (HCC) Stable after endarterectomy. Followed by vascular.  6. Skin lesion Refer to dermatology to R/O squamous cell carcinoma. - Ambulatory referral to Dermatology  7. Encounter for screening mammogram for breast cancer Ordered mammogram as below. Encouraged pt to get this imaging done. - MM DIGITAL SCREENING BILATERAL  8. Alcohol drinker Encouraged pt to decrease alcohol consumption.   Patient seen and examined by Julieanne Manson, MD, and note scribed by Allene Dillon, CMA. I have done the exam and reviewed the above chart and it is accurate to the best of my knowledge. Dentist has been used in this note in any air is in the dictation or transcription are unintentional.  Megan Mans, MD  [facility]

## 2016-05-15 ENCOUNTER — Other Ambulatory Visit: Payer: Self-pay | Admitting: Family Medicine

## 2016-06-15 ENCOUNTER — Other Ambulatory Visit: Payer: Self-pay | Admitting: Family Medicine

## 2016-06-15 DIAGNOSIS — F419 Anxiety disorder, unspecified: Secondary | ICD-10-CM

## 2016-08-17 ENCOUNTER — Other Ambulatory Visit: Payer: Self-pay

## 2016-08-17 ENCOUNTER — Other Ambulatory Visit: Payer: Self-pay | Admitting: Family Medicine

## 2016-08-17 MED ORDER — SERTRALINE HCL 50 MG PO TABS: ORAL_TABLET | ORAL | 12 refills | Status: DC

## 2016-08-17 MED ORDER — OMEPRAZOLE 20 MG PO CPDR: 20.0000 mg | DELAYED_RELEASE_CAPSULE | Freq: Every day | ORAL | 12 refills | Status: DC

## 2016-08-17 NOTE — Telephone Encounter (Signed)
[facility] faxed a request for the following medications.  Thanks CC  omeprazole (PRILOSEC) 20 MG capsule  >Take one (1) capsule each day.   sertraline (ZOLOFT) 50 MG tablet  >Take one (1) tablet each day.

## 2016-08-17 NOTE — Telephone Encounter (Signed)
Done-aa 

## 2016-08-25 ENCOUNTER — Encounter: Payer: Self-pay | Admitting: Vascular Surgery

## 2016-11-16 ENCOUNTER — Other Ambulatory Visit: Payer: Self-pay | Admitting: Family Medicine

## 2016-11-16 DIAGNOSIS — F419 Anxiety disorder, unspecified: Secondary | ICD-10-CM

## 2016-11-16 NOTE — Telephone Encounter (Signed)
rx called in-Julizza Sassone V Berdell Nevitt, RMA  

## 2016-11-16 NOTE — Telephone Encounter (Signed)
Please review for dr gilbert-Laryah Neuser V Saverio Kader, RMA  

## 2016-11-23 ENCOUNTER — Ambulatory Visit: Payer: 59 | Admitting: Family Medicine

## 2016-12-16 ENCOUNTER — Other Ambulatory Visit: Payer: Self-pay | Admitting: Family Medicine

## 2016-12-16 DIAGNOSIS — F419 Anxiety disorder, unspecified: Secondary | ICD-10-CM

## 2017-03-31 ENCOUNTER — Ambulatory Visit: Payer: 59 | Admitting: Family Medicine

## 2017-03-31 ENCOUNTER — Encounter: Payer: Self-pay | Admitting: Family Medicine

## 2017-03-31 VITALS — BP 116/76 | HR 87 | Temp 97.5°F | Resp 16 | Wt 144.6 lb

## 2017-03-31 DIAGNOSIS — F419 Anxiety disorder, unspecified: Secondary | ICD-10-CM

## 2017-03-31 DIAGNOSIS — I6522 Occlusion and stenosis of left carotid artery: Secondary | ICD-10-CM | POA: Diagnosis not present

## 2017-03-31 MED ORDER — SERTRALINE HCL 100 MG PO TABS: ORAL_TABLET | ORAL | 12 refills | Status: DC

## 2017-03-31 MED ORDER — CLONAZEPAM 1 MG PO TABS: ORAL_TABLET | ORAL | 3 refills | Status: DC

## 2017-03-31 NOTE — Progress Notes (Signed)
Name: ROVENA HEARLD   MRN: [id number]    DOB: [birth year]   Date:03/31/2017        Patient: Melanie Mitchell Female    DOB: [birth year]   62 y.o.   MRN: [id number] Visit Date: 03/31/2017  Today's Provider: Megan Mans, MD   Chief Complaint  Patient presents with  . Anxiety  . Depression  . Hypertension  . Hyperlipidemia  . Gastroesophageal Reflux    Follow up from 12/03/15  . Nicotine Dependence    Patient would like to discuss being put on medication to quit smoking. Patient reports that she smokes 3-5 cigs a day.    Subjective:    Anxiety  Presents for follow-up visit. Symptoms include decreased concentration (patient states that she has been under alot of pressure since the death of her mother), depressed mood, excessive worry, irritability, malaise, nervous/anxious behavior and panic. Patient reports no chest pain, compulsions, confusion, dizziness, dry mouth, feeling of choking, hyperventilation, impotence, insomnia, muscle tension, nausea, obsessions, palpitations, restlessness, shortness of breath or suicidal ideas. Symptoms occur most days. The severity of symptoms is mild (mild to moderate). The quality of sleep is poor (patient states that some days she sleeps up to 14hrs, she has decreased Clonazepam due to fatigue).   Compliance with medications is 76-100%.   GAD 7 : Generalized Anxiety Score 03/31/2017 05/13/2016  Nervous, Anxious, on Edge 1 1  Control/stop worrying 3 1  Worry too much - different things 3 1  Trouble relaxing 1 1  Restless 2 3  Easily annoyed or irritable 2 1  Afraid - awful might happen 2 1  Total GAD 7 Score 14 9  Anxiety Difficulty Somewhat difficult Very difficult         Depression, Follow-up  She  was last seen for this 10 months ago. Changes made at last visit include none, condition was stable on Zoloft.   She reports excellent compliance with treatment. She is not having side effects.   She reports good tolerance of  treatment. Current symptoms include: depressed mood, difficulty concentrating and fatigue She feels she is Unchanged since last visit. Patient reports that she is compliant with Sertraline but states that she has not noticed a change in her mood and symptoms. She reports taking Clonazepam has helped   Depression screen [facility] 2/9 03/31/2017 05/13/2016 07/29/2015 02/21/2015 02/21/2015  Decreased Interest 2 1 3 2  0  Down, Depressed, Hopeless 1 1 2 1 1   PHQ - 2 Score 3 2 5 3 1   Altered sleeping 3 3 1 2  -  Tired, decreased energy 3 1 1 1  -  Change in appetite 1 1 1 1  -  Feeling bad or failure about yourself  1 1 3 1  -  Trouble concentrating 1 0 0 0 -  Moving slowly or fidgety/restless 0 0 0 0 -  Suicidal thoughts 0 0 0 0 -  PHQ-9 Score 12 8 11 8  -  Difficult doing work/chores Somewhat difficult Somewhat difficult - Somewhat difficult -   -----------------------------------------------------------------------   Hypertension, follow-up:  BP Readings from Last 3 Encounters:  03/31/17 116/76  05/13/16 116/68  12/03/15 (!) 142/78    She was last seen for hypertension 1 years ago.  BP at that visit was 142/78. Management since that visit includes none.She reports excellent compliance with treatment. She is not having side effects.  She is not exercising. She is not adherent to low salt diet.   Outside blood pressures are not being checked. She  is experiencing none.  Patient denies chest pain, chest pressure/discomfort, claudication, dyspnea, exertional chest pressure/discomfort, irregular heart beat, lower extremity edema, near-syncope, orthopnea, palpitations, paroxysmal nocturnal dyspnea, syncope and tachypnea.   Cardiovascular risk factors include advanced age (older than 5155 for men, 3265 for women) and smoking/ tobacco exposure.  Use of agents associated with hypertension: NSAIDS.   ------------------------------------------------------------------------    Lipid/Cholesterol, Follow-up:    Last seen for this 1 years ago.  Management since that visit includes none.  Last Lipid Panel:    Component Value Date/Time   CHOL 197 11/29/2015 0920   TRIG 238 (H) 11/29/2015 0920   HDL 52 11/29/2015 0920   LDLCALC 97 11/29/2015 0920    She reports excellent compliance with treatment. She is not having side effects. none  Wt Readings from Last 3 Encounters:  03/31/17 144 lb 9.6 oz (65.6 kg)  05/13/16 141 lb (64 kg)  12/03/15 142 lb (64.4 kg)    ------------------------------------------------------------------------    Allergies  Allergen Reactions  . Atorvastatin Diarrhea  . Oxycodone Nausea And Vomiting    Mental changes  . Penicillins Other (See Comments)     Current Outpatient Medications:  .  aspirin EC 81 MG EC tablet, Take 1 tablet (81 mg total) by mouth daily., Disp: 60 tablet, Rfl: 1 .  Cholecalciferol (VITAMIN D) 2000 UNITS tablet, Take by mouth., Disp: , Rfl:  .  clonazePAM (KLONOPIN) 1 MG tablet, TAKE ONE TABLET BY MOUTH TWO TIMES A DAY AS NEEDED FOR ANXIETY, Disp: 60 tablet, Rfl: 3 .  losartan-hydrochlorothiazide (HYZAAR) 50-12.5 MG tablet, TAKE ONE (1) TABLET EACH DAY, Disp: 30 tablet, Rfl: 12 .  omeprazole (PRILOSEC) 20 MG capsule, Take 1 capsule (20 mg total) by mouth daily., Disp: 30 capsule, Rfl: 12 .  sertraline (ZOLOFT) 50 MG tablet, TAKE ONE (1) TABLET EACH DAY, Disp: 30 tablet, Rfl: 12 .  simvastatin (ZOCOR) 40 MG tablet, TAKE ONE (1) TABLET EACH DAY, Disp: 30 tablet, Rfl: 11  Review of Systems  Constitutional: Positive for irritability.  Respiratory: Negative for shortness of breath.   Cardiovascular: Negative for chest pain and palpitations.  Gastrointestinal: Negative for nausea.  Genitourinary: Negative for impotence.  Neurological: Negative for dizziness.  Psychiatric/Behavioral: Positive for decreased concentration (patient states that she has been under alot of pressure since the death of her mother). Negative for confusion and  suicidal ideas. The patient is nervous/anxious. The patient does not have insomnia.     Social History   Tobacco Use  . Smoking status: Current Every Day Smoker    Packs/day: 0.25    Years: 30.00    Pack years: 7.50    Types: Cigarettes  . Smokeless tobacco: Never Used  Substance Use Topics  . Alcohol use: Yes    Comment: drinks 2 to 3 beers 2 to 3 times a week.   Objective:   BP 116/76   Pulse 87   Temp (!) 97.5 F (36.4 C) (Oral)   Resp 16   Wt 144 lb 9.6 oz (65.6 kg)   SpO2 98%   BMI 21.99 kg/m  Vitals:   03/31/17 1547  BP: 116/76  Pulse: 87  Resp: 16  Temp: (!) 97.5 F (36.4 C)  TempSrc: Oral  SpO2: 98%  Weight: 144 lb 9.6 oz (65.6 kg)     Physical Exam  Constitutional: She is oriented to person, place, and time. She appears well-developed and well-nourished.  HENT:  Head: Normocephalic and atraumatic.  Right Ear: External ear normal.  Left  Ear: External ear normal.  Nose: Nose normal.  Mouth/Throat: Oropharynx is clear and moist.  Eyes: Conjunctivae are normal. No scleral icterus.  Neck: No thyromegaly present.  Cardiovascular: Normal rate, regular rhythm and normal heart sounds.  Pulmonary/Chest: Effort normal and breath sounds normal.  Abdominal: Soft.  Neurological: She is alert and oriented to person, place, and time.  Skin: Skin is warm and dry.  Psychiatric: She has a normal mood and affect. Her behavior is normal. Judgment normal.        Assessment & Plan:     1. Anxiety disorder, unspecified type Increase sertraline to 100mg  daily.RTC 2 months. - clonazePAM (KLONOPIN) 1 MG tablet; TAKE ONE TABLET BY MOUTH TWO TIMES A DAY AS NEEDED FOR ANXIETY  Dispense: 60 tablet; Refill: 3      I have done the exam and reviewed the above chart and it is accurate to the best of my knowledge. Dentist has been used in this note in any air is in the dictation or transcription are unintentional.  Megan Mans, MD  [facility]

## 2017-05-11 ENCOUNTER — Ambulatory Visit: Payer: 59 | Admitting: Physician Assistant

## 2017-05-11 ENCOUNTER — Encounter: Payer: Self-pay | Admitting: Physician Assistant

## 2017-05-11 VITALS — BP 114/66 | HR 88 | Temp 98.6°F | Resp 20 | Wt 139.0 lb

## 2017-05-11 DIAGNOSIS — J40 Bronchitis, not specified as acute or chronic: Secondary | ICD-10-CM

## 2017-05-11 MED ORDER — AZITHROMYCIN 250 MG PO TABS: ORAL_TABLET | ORAL | 0 refills | Status: DC

## 2017-05-11 MED ORDER — PREDNISONE 10 MG PO TABS: 10.0000 mg | ORAL_TABLET | Freq: Two times a day (BID) | ORAL | 0 refills | Status: AC

## 2017-05-11 MED ORDER — ALBUTEROL SULFATE HFA 108 (90 BASE) MCG/ACT IN AERS: 2.0000 | INHALATION_SPRAY | Freq: Four times a day (QID) | RESPIRATORY_TRACT | 2 refills | Status: DC | PRN

## 2017-05-11 NOTE — Progress Notes (Signed)
[facility]  Chief Complaint  Patient presents with  . URI    Subjective:    Patient ID: Melanie Mitchell Doig, female    DOB: [birth year], 62 y.o.   MRN: [id number]  Upper Respiratory Infection: Melanie Mitchell Urbas is a 62 y.o. female with a past medical history significant for pack per day smoking since age 62  complaining of symptoms of a URI, possible sinusitis. Symptoms include congestion, cough and plugged sensation in both ears. Onset of symptoms was 6 days ago, gradually worsening since that time. She also c/o congestion, cough described as productive, nasal congestion, post nasal drip and shortness of breath for the past 6 days .  She is drinking plenty of fluids. Evaluation to date: none. Treatment to date: cough suppressants and decongestants. The treatment has provided minima relief.  Review of Systems  Constitutional: Positive for fatigue. Negative for activity change, appetite change, chills, diaphoresis, fever and unexpected weight change.  HENT: Positive for congestion, postnasal drip, rhinorrhea and sneezing. Negative for ear discharge, ear pain, sinus pressure, sinus pain, sore throat and trouble swallowing.   Eyes: Negative.   Respiratory: Positive for cough, shortness of breath and wheezing. Negative for apnea, choking, chest tightness and stridor.   Gastrointestinal: Negative.   Musculoskeletal: Negative for neck pain and neck stiffness.  Neurological: Positive for dizziness. Negative for light-headedness and headaches.       Objective:   BP 114/66 (BP Location: Right Arm, Patient Position: Sitting, Cuff Size: Normal)   Pulse 88   Temp 98.6 F (37 C) (Oral)   Resp 20   Wt 139 lb (63 kg)   SpO2 94%   BMI 21.13 kg/m   Patient Active Problem List   Diagnosis Date Noted  . Carotid stenosis 04/05/2015  . Atypical chest pain 03/08/2015  . Carotid artery disease (HCC) 03/08/2015  . Combined fat and carbohydrate induced  hyperlipemia 03/08/2015  . Abnormal LFTs 07/20/2014  . Alcohol drinker 07/20/2014  . Anxiety 07/20/2014  . Arthritis 07/20/2014  . Pneumonia, organism unspecified(486) 07/20/2014  . Clinical depression 07/20/2014  . Acid reflux 07/20/2014  . HLD (hyperlipidemia) 07/20/2014  . BP (high blood pressure) 07/20/2014  . Hemorrhoid 07/20/2014  . Avitaminosis Mitchell 07/20/2014  . Panic attack 07/20/2014  . Anxiety disorder 07/20/2014  . Essential (primary) hypertension 07/20/2014  . Other specified health status 07/20/2014    Outpatient Encounter Medications as of 05/11/2017  Medication Sig Note  . aspirin EC 81 MG EC tablet Take 1 tablet (81 mg total) by mouth daily.   . Cholecalciferol (VITAMIN Mitchell) 2000 UNITS tablet Take by mouth. 07/20/2014: [facility]  . clonazePAM (KLONOPIN) 1 MG tablet TAKE ONE TABLET BY MOUTH TWO TIMES A DAY AS NEEDED FOR ANXIETY   . losartan-hydrochlorothiazide (HYZAAR) 50-12.5 MG tablet TAKE ONE (1) TABLET EACH DAY   . omeprazole (PRILOSEC) 20 MG capsule Take 1 capsule (20 mg total) by mouth daily.   . sertraline (ZOLOFT) 100 MG tablet TAKE ONE (1) TABLET EACH DAY   . simvastatin (ZOCOR) 40 MG tablet TAKE ONE (1) TABLET EACH DAY   . albuterol (PROVENTIL HFA;VENTOLIN HFA) 108 (90 Base) MCG/ACT inhaler Inhale 2 puffs into the lungs every 6 (six) hours as needed for wheezing or shortness of breath.   Marland Kitchen. azithromycin (ZITHROMAX) 250 MG tablet Take 2 pills on day 1, one pill on the following four days.   . predniSONE (DELTASONE) 10 MG tablet Take 1 tablet (10 mg total)  by mouth 2 (two) times daily with a meal for 5 days.    No facility-administered encounter medications on file as of 05/11/2017.     Allergies  Allergen Reactions  . Atorvastatin Diarrhea  . Oxycodone Nausea And Vomiting    Mental changes  . Penicillins Other (See Comments)       Physical Exam  Constitutional: She is oriented to person, place, and time. She appears  well-developed and well-nourished.  Cardiovascular: Normal rate and regular rhythm.  Pulmonary/Chest: Effort normal. No respiratory distress. She has wheezes.  Diffuse wheezing and rhonchi throughout bilateral lung fields.  Neurological: She is alert and oriented to person, place, and time.  Skin: Skin is warm and dry.  Psychiatric: She has a normal mood and affect. Her behavior is normal.       Assessment & Plan:  1. Bronchitis  Will treat as COPD exacerbation due to long smoking history. She needs PFTs at some point in the future.   - azithromycin (ZITHROMAX) 250 MG tablet; Take 2 pills on day 1, one pill on the following four days.  Dispense: 6 tablet; Refill: 0 - predniSONE (DELTASONE) 10 MG tablet; Take 1 tablet (10 mg total) by mouth 2 (two) times daily with a meal for 5 days.  Dispense: 10 tablet; Refill: 0 - albuterol (PROVENTIL HFA;VENTOLIN HFA) 108 (90 Base) MCG/ACT inhaler; Inhale 2 puffs into the lungs every 6 (six) hours as needed for wheezing or shortness of breath.  Dispense: 1 Inhaler; Refill: 2  Patient Instructions  Acute Bronchitis, Adult Acute bronchitis is when air tubes (bronchi) in the lungs suddenly get swollen. The condition can make it hard to breathe. It can also cause these symptoms:  A cough.  Coughing up clear, yellow, or green mucus.  Wheezing.  Chest congestion.  Shortness of breath.  A fever.  Body aches.  Chills.  A sore throat.  Follow these instructions at home: Medicines  Take over-the-counter and prescription medicines only as told by your doctor.  If you were prescribed an antibiotic medicine, take it as told by your doctor. Do not stop taking the antibiotic even if you start to feel better. General instructions  Rest.  Drink enough fluids to keep your pee (urine) clear or pale yellow.  Avoid smoking and secondhand smoke. If you smoke and you need help quitting, ask your doctor. Quitting will help your lungs heal  faster.  Use an inhaler, cool mist vaporizer, or humidifier as told by your doctor.  Keep all follow-up visits as told by your doctor. This is important. How is this prevented? To lower your risk of getting this condition again:  Wash your hands often with soap and water. If you cannot use soap and water, use hand sanitizer.  Avoid contact with people who have cold symptoms.  Try not to touch your hands to your mouth, nose, or eyes.  Make sure to get the flu shot every year.  Contact a doctor if:  Your symptoms do not get better in 2 weeks. Get help right away if:  You cough up blood.  You have chest pain.  You have very bad shortness of breath.  You become dehydrated.  You faint (pass out) or keep feeling like you are going to pass out.  You keep throwing up (vomiting).  You have a very bad headache.  Your fever or chills gets worse. This information is not intended to replace advice given to you by your health care provider. Make sure you  discuss any questions you have with your health care provider. Document Released: 07/08/2007 Document Revised: 08/28/2015 Document Reviewed: 07/10/2015 Elsevier Interactive Patient Education  [employer].     The entirety of the information documented in the History of Present Illness, Review of Systems and Physical Exam were personally obtained by me. Portions of this information were initially documented by Kavin Leech, CMA and reviewed by me for thoroughness and accuracy.

## 2017-05-11 NOTE — Patient Instructions (Signed)

## 2017-05-18 ENCOUNTER — Other Ambulatory Visit: Payer: Self-pay | Admitting: Family Medicine

## 2017-06-02 ENCOUNTER — Ambulatory Visit (INDEPENDENT_AMBULATORY_CARE_PROVIDER_SITE_OTHER): Payer: 59 | Admitting: Family Medicine

## 2017-06-02 VITALS — BP 118/64 | HR 86 | Temp 97.4°F | Resp 16 | Wt 143.0 lb

## 2017-06-02 DIAGNOSIS — Z1211 Encounter for screening for malignant neoplasm of colon: Secondary | ICD-10-CM

## 2017-06-02 DIAGNOSIS — Z0000 Encounter for general adult medical examination without abnormal findings: Secondary | ICD-10-CM | POA: Diagnosis not present

## 2017-06-02 NOTE — Progress Notes (Signed)
Patient: Melanie Mitchell, Female    DOB: 1955/10/31, 62 y.o.   MRN: [id number] Visit Date: 06/02/2017  Today's Provider: Megan Mans, MD   Chief Complaint  Patient presents with  . Annual Exam   Subjective:  Melanie Mitchell is a 62 y.o. female who presents today for health maintenance and complete physical. She feels fairly well. She reports exercising none. She reports she is sleeping too much.  Immunization History  Administered Date(s) Administered  . Influenza Whole 11/12/2015  . Influenza,inj,Quad PF,6+ Mos 11/21/2014, 10/08/2016  . Pneumococcal Polysaccharide-23 08/15/2012  . Tdap 08/15/2012   08/18/11 Mammogram-negative 02/21/15 Pap with HPV-normal and negative 08/20/11 BMD-Osteopenia Colonoscopy-none   Review of Systems  Constitutional: Negative.        Crying and irritable  HENT: Negative.           Eyes: Positive for redness.  Respiratory: Negative.   Cardiovascular: Positive for leg swelling.  Gastrointestinal: Negative.   Endocrine: Negative.   Genitourinary: Negative.   Musculoskeletal: Negative.   Skin: Negative.   Allergic/Immunologic: Negative.   Neurological: Negative.   Hematological: Bruises/bleeds easily (takes aspirin daily).  Psychiatric/Behavioral: Negative.     Social History   Socioeconomic History  . Marital status: Single    Spouse name: N/A  . Number of children: 0  . Years of education: some college  . Highest education level: Not on file  Occupational History    Employer: [employer]  Social Needs  . Financial resource strain: Not on file  . Food insecurity:    Worry: Not on file    Inability: Not on file  . Transportation needs:    Medical: Not on file    Non-medical: Not on file  Tobacco Use  . Smoking status: Current Every Day Smoker    Packs/day: 0.25    Years: 30.00    Pack years: 7.50    Types: Cigarettes  . Smokeless tobacco: Never Used  Substance and Sexual Activity  . Alcohol use: Yes    Comment: drinks  2 to 3 beers 2 to 3 times a week.  . Drug use: No  . Sexual activity: Never  Lifestyle  . Physical activity:    Days per week: Not on file    Minutes per session: Not on file  . Stress: Not on file  Relationships  . Social connections:    Talks on phone: Not on file    Gets together: Not on file    Attends religious service: Not on file    Active member of club or organization: Not on file    Attends meetings of clubs or organizations: Not on file    Relationship status: Not on file  . Intimate partner violence:    Fear of current or ex partner: Not on file    Emotionally abused: Not on file    Physically abused: Not on file    Forced sexual activity: Not on file  Other Topics Concern  . Not on file  Social History Narrative  . Not on file    Patient Active Problem List   Diagnosis Date Noted  . Carotid stenosis 04/05/2015  . Atypical chest pain 03/08/2015  . Carotid artery disease (HCC) 03/08/2015  . Combined fat and carbohydrate induced hyperlipemia 03/08/2015  . Abnormal LFTs 07/20/2014  . Alcohol drinker 07/20/2014  . Anxiety 07/20/2014  . Arthritis 07/20/2014  . Pneumonia, organism unspecified(486) 07/20/2014  . Clinical depression 07/20/2014  . Acid reflux 07/20/2014  . HLD (  hyperlipidemia) 07/20/2014  . BP (high blood pressure) 07/20/2014  . Hemorrhoid 07/20/2014  . Avitaminosis D 07/20/2014  . Panic attack 07/20/2014  . Anxiety disorder 07/20/2014  . Essential (primary) hypertension 07/20/2014  . Other specified health status 07/20/2014    Past Surgical History:  Procedure Laterality Date  . ANKLE FRACTURE SURGERY  2008   steel plate and screws put in  . ENDARTERECTOMY Left 04/05/2015   Procedure: ENDARTERECTOMY CAROTID;  Surgeon: Renford Dills, MD;  Location: [facility] ORS;  Service: Vascular;  Laterality: Left;  . stone in the salitory gland  2004  . TONSILLECTOMY  1960  . 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     Outpatient Encounter Medications as of 06/02/2017  Medication Sig Note  . aspirin EC 81 MG EC tablet Take 1 tablet (81 mg total) by mouth daily.   . Cholecalciferol (VITAMIN D) 2000 UNITS tablet Take by mouth. 07/20/2014: Received from: [facility]  . clonazePAM (KLONOPIN) 1 MG tablet TAKE ONE TABLET BY MOUTH TWO TIMES A DAY AS NEEDED FOR ANXIETY   . losartan-hydrochlorothiazide (HYZAAR) 50-12.5 MG tablet TAKE ONE (1) TABLET EACH DAY   . omeprazole (PRILOSEC) 20 MG capsule Take 1 capsule (20 mg total) by mouth daily.   . sertraline (ZOLOFT) 100 MG tablet TAKE ONE (1) TABLET EACH DAY 06/02/2017: Patient states that 100 mg makes her fall asleep during the day and she is only taking 1/2 daily.  . simvastatin (ZOCOR) 40 MG tablet TAKE ONE (1) TABLET EACH DAY   . [DISCONTINUED] albuterol (PROVENTIL HFA;VENTOLIN HFA) 108 (90 Base) MCG/ACT inhaler Inhale 2 puffs into the lungs every 6 (six) hours as needed for wheezing or shortness of breath.   . [DISCONTINUED] azithromycin (ZITHROMAX) 250 MG tablet Take 2 pills on day 1, one pill on the following four days.    No facility-administered encounter medications on file as of 06/02/2017.     Patient Care Team: Maple Hudson., MD as PCP - General (Family Medicine)      Objective:   Vitals:  Vitals:   06/02/17 1025  BP: 118/64  Pulse: 86  Resp: 16  Temp: (!) 97.4 F (36.3 C)  TempSrc: Oral  Weight: 143 lb (64.9 kg)    Physical Exam  Constitutional: She is oriented to person, place, and time. She appears well-developed and well-nourished.  HENT:  Head: Normocephalic and atraumatic.  Right Ear: External ear normal.  Left Ear: External ear normal.  Nose: Nose normal.  Mouth/Throat: Oropharynx is clear and moist.  Eyes: Pupils are equal,  round, and reactive to light. Conjunctivae and EOM are normal.  Neck: Normal range of motion. Neck supple.  Cardiovascular: Normal rate, regular rhythm, normal heart sounds and intact distal pulses.  Soft carotid left bruit  Pulmonary/Chest: Effort normal and breath sounds normal.  Abdominal: Soft. Bowel sounds are normal.  Musculoskeletal: Normal range of motion. She exhibits edema (chronic swelling left, secondary to old injury).  Neurological: She is alert and oriented to person, place, and time.  Skin: Skin is warm and dry.  Psychiatric: She has a normal mood and affect. Her behavior is normal. Judgment and thought content normal.   Fall Risk  06/02/2017 05/13/2016 02/21/2015  Falls in the past year? No No No  Functional Status Survey: Is the patient deaf or have difficulty hearing?: No Does the patient have difficulty seeing, even when wearing glasses/contacts?: Yes Does the patient have difficulty concentrating, remembering, or making decisions?: Yes Does the patient have difficulty walking or climbing stairs?: Yes Does the patient have difficulty dressing or bathing?: No Does the patient have difficulty doing errands alone such as visiting a doctor's office or shopping?: No   Depression Screen PHQ 2/9 Scores 06/02/2017 03/31/2017 05/13/2016 07/29/2015  PHQ - 2 Score PHQ- 9 Score Assessment & Plan:     Routine Health Maintenance and Physical Exam  Exercise Activities and Dietary recommendations Goals    None      Immunization History  Administered Date(s) Administered  . Influenza Whole 11/12/2015  . Influenza,inj,Quad PF,6+ Mos 11/21/2014, 10/08/2016  . Pneumococcal Polysaccharide-23 08/15/2012  . Tdap 08/15/2012    Health Maintenance  Topic Date Due  . Hepatitis C Screening  August 31, 1955  . HIV Screening  11/22/1969  . COLONOSCOPY  11/22/2004  . MAMMOGRAM  08/17/2013  . INFLUENZA VACCINE  09/02/2017  . PAP SMEAR  02/20/2018  .  TETANUS/TDAP  08/16/2022    Pt wishes cologuard over colonoscopy.Pap 2022. Discussed health benefits of physical activity, and encouraged her to engage in regular exercise appropriate for her age and condition.    I have done the exam and reviewed the chart and it is accurate to the best of my knowledge. Dentist has been used and  any errors in dictation or transcription are unintentional. Julieanne Manson M.D. [facility]

## 2017-06-11 IMAGING — CT CT ANGIO NECK
2 of 7 series · 9 of 33 positions shown · IV contrast (APPLIED)
Comparison: Soft tissue neck CT 06/09/2012

CLINICAL DATA: Bilateral carotid artery stenosis. Remote history of
right submandibular gland resection for reportedly benign tumor and
stones.

EXAM:
CT ANGIOGRAPHY NECK
TECHNIQUE: Multidetector CT imaging of the neck was performed using the
standard protocol during bolus administration of intravenous
contrast. Multiplanar CT image reconstructions and MIPs were
obtained to evaluate the vascular anatomy. Carotid stenosis
measurements (when applicable) are obtained utilizing NASCET
criteria, using the distal internal carotid diameter as the
denominator.
CONTRAST:  80mL OMNIPAQUE IOHEXOL 350 MG/ML SOLN

[Series 4: cta neck · axial · 0.57mm/px · z∈[+236,+368]mm · 4 of 221 slices shown]
[im 45/221  soft-tissue]
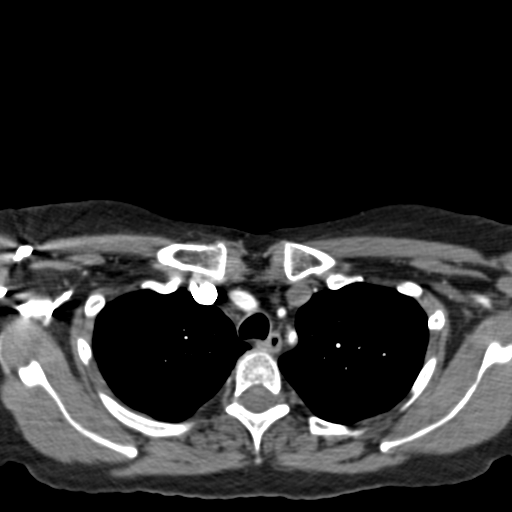
[im 89/221  soft-tissue]
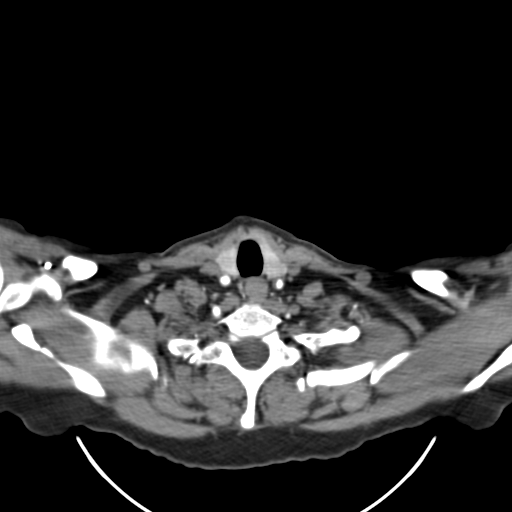
[im 133/221  soft-tissue]
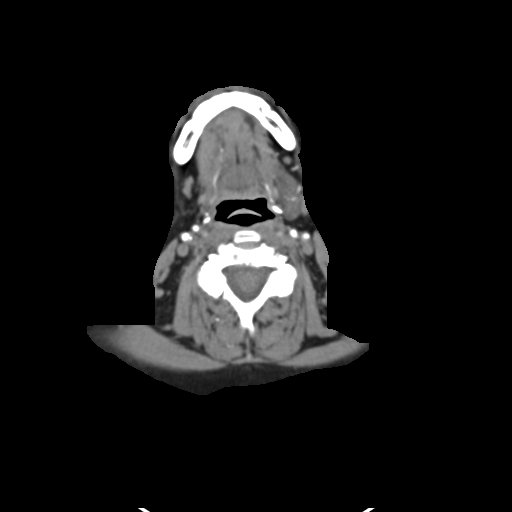
[im 177/221  soft-tissue]
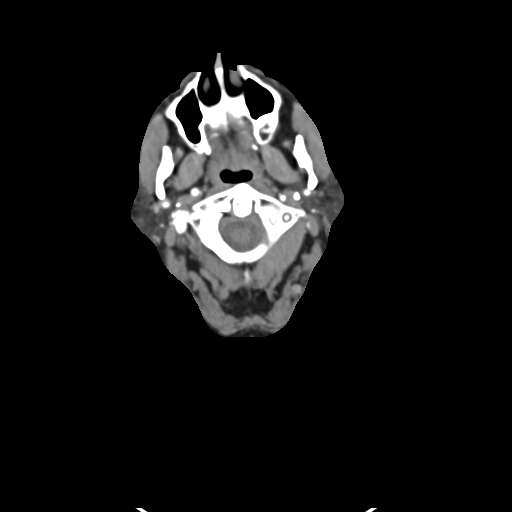

[Series 6: ax thin · axial · 0.39mm/px · z∈[+176,+359]mm · 5 of 294 slices shown]
[im 49/294  soft-tissue]
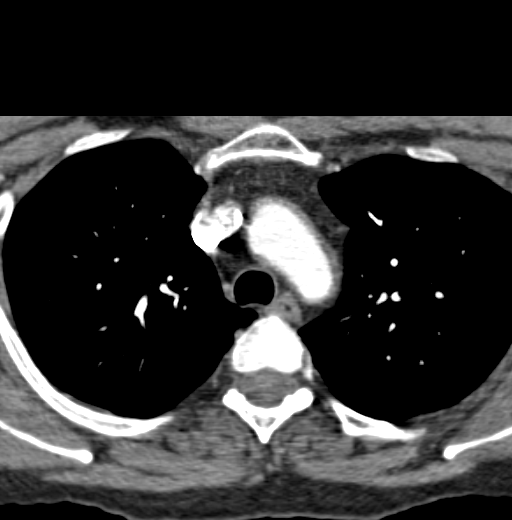
[im 98/294  bone]
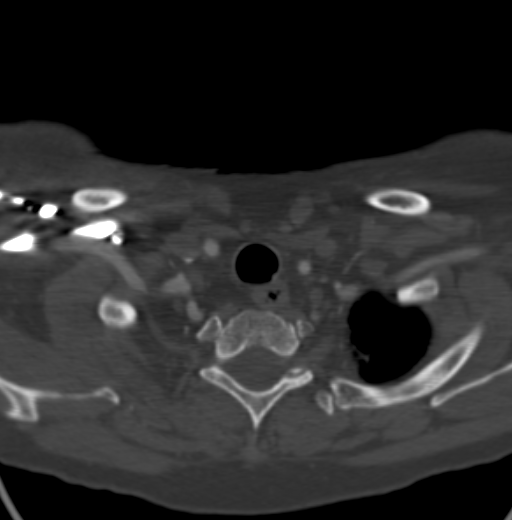
[im 147/294  soft-tissue]
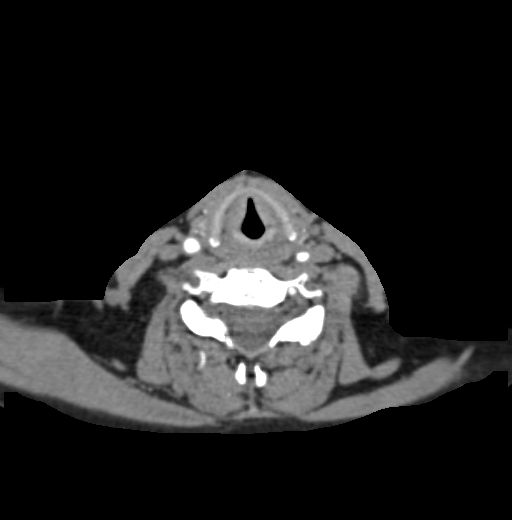
[im 196/294  bone]
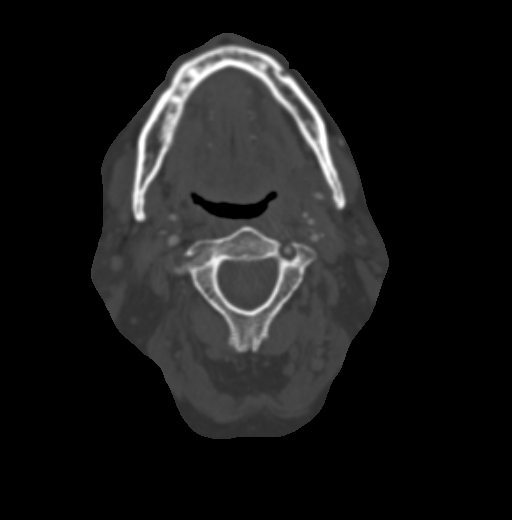
[im 245/294  soft-tissue]
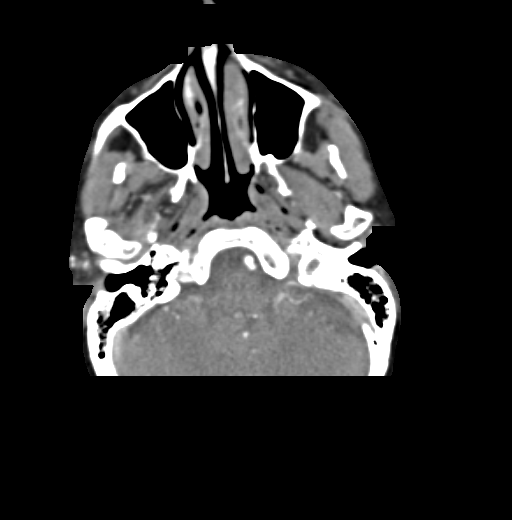

[9 of 33 positions shown; findings below may reference images not displayed]

FINDINGS: Aortic arch: 3 vessel aortic arch. Noncalcified plaque/intimal
thickening in the brachiocephalic artery does not result in
significant stenosis. Right subclavian artery is widely patent.
There is irregular, calcified and noncalcified plaque throughout the
proximal left subclavian artery resulting in a high-grade stenosis
of 80% or greater approximately 1.5 cm proximal to the left
vertebral artery origin.

Right carotid system: The common carotid artery is patent without
significant stenosis. Mixed calcified and noncalcified plaque at the
carotid bifurcation results in 50% stenosis of the ICA origin. There
is mild proximal ECA narrowing as well.

Left carotid system: Noncalcified plaque in the proximal and mid
common carotid artery results in less than 50% luminal narrowing.
Noncalcified plaque at the carotid bifurcation results in high-grade
stenosis of the ICA origin estimated at 80%. A second, slightly less
severe stenosis is noted proximally 10 mm more distally. The more
distal cervical ICA is patent without additional stenosis although
it is smaller in caliber than the contralateral side which may
reflect reduced flow due to the severity of the proximal stenosis
and/or developmental asymmetry.

Vertebral arteries: The vertebral arteries are patent with the right
being dominant. No vertebral artery stenosis is identified.

Skeleton: Advanced left facet arthrosis at C3-4.

Other neck: Mild scarring in the lung apices. Absent right
submandibular gland.
IMPRESSION: 1. High-grade proximal left internal carotid artery stenosis,
approximately 80%.
2. 50% proximal right ICA stenosis.
3. 80% or greater stenosis of the left subclavian artery.
4. Widely patent vertebral arteries.

## 2017-08-16 ENCOUNTER — Other Ambulatory Visit: Payer: Self-pay | Admitting: Family Medicine

## 2017-08-16 DIAGNOSIS — F419 Anxiety disorder, unspecified: Secondary | ICD-10-CM

## 2017-08-16 NOTE — Telephone Encounter (Signed)
Pharmacy requesting refills. Thanks!  

## 2017-09-10 ENCOUNTER — Other Ambulatory Visit: Payer: Self-pay

## 2017-09-10 MED ORDER — OMEPRAZOLE 20 MG PO CPDR: 20.0000 mg | DELAYED_RELEASE_CAPSULE | Freq: Every day | ORAL | 12 refills | Status: DC

## 2017-09-14 ENCOUNTER — Ambulatory Visit: Payer: Self-pay | Admitting: Family Medicine

## 2017-11-18 ENCOUNTER — Other Ambulatory Visit: Payer: Self-pay | Admitting: Family Medicine

## 2017-11-18 DIAGNOSIS — F419 Anxiety disorder, unspecified: Secondary | ICD-10-CM

## 2018-02-07 ENCOUNTER — Ambulatory Visit (INDEPENDENT_AMBULATORY_CARE_PROVIDER_SITE_OTHER): Payer: 59 | Admitting: Family Medicine

## 2018-02-07 VITALS — BP 142/78 | HR 91 | Temp 97.9°F | Resp 16 | Wt 146.0 lb

## 2018-02-07 DIAGNOSIS — R945 Abnormal results of liver function studies: Secondary | ICD-10-CM | POA: Diagnosis not present

## 2018-02-07 DIAGNOSIS — K219 Gastro-esophageal reflux disease without esophagitis: Secondary | ICD-10-CM | POA: Diagnosis not present

## 2018-02-07 DIAGNOSIS — I10 Essential (primary) hypertension: Secondary | ICD-10-CM

## 2018-02-07 DIAGNOSIS — I6522 Occlusion and stenosis of left carotid artery: Secondary | ICD-10-CM

## 2018-02-07 DIAGNOSIS — R7989 Other specified abnormal findings of blood chemistry: Secondary | ICD-10-CM

## 2018-02-07 DIAGNOSIS — Z789 Other specified health status: Secondary | ICD-10-CM

## 2018-02-07 DIAGNOSIS — E785 Hyperlipidemia, unspecified: Secondary | ICD-10-CM | POA: Diagnosis not present

## 2018-02-07 DIAGNOSIS — Z7289 Other problems related to lifestyle: Secondary | ICD-10-CM

## 2018-02-07 MED ORDER — OMEPRAZOLE 20 MG PO CPDR: 20.0000 mg | DELAYED_RELEASE_CAPSULE | Freq: Every day | ORAL | 12 refills | Status: DC

## 2018-02-07 NOTE — Progress Notes (Signed)
Melanie LawlessJoan D Mitchell  MRN: [id number] DOB: July 23, 1955  Subjective:  HPI   The patient is a 63 year old female who presents for semi-annual exam for chronic health.  She was last seen on 06/02/17 for her annual physical.  Pt says she has cut back on smoking and drinking. Hypertension BP Readings from Last 3 Encounters:  02/07/18 (!) 142/78  06/02/17 118/64  05/11/17 114/66   Hyperthyroidism Lab Results  Component Value Date   TSH 3.800 11/29/2015   Hyperlipidemia Lab Results  Component Value Date   CHOL 197 11/29/2015   HDL 52 11/29/2015   LDLCALC 97 11/29/2015   TRIG 238 (H) 11/29/2015    Patient Active Problem List   Diagnosis Date Noted  . Carotid stenosis 04/05/2015  . Atypical chest pain 03/08/2015  . Carotid artery disease (HCC) 03/08/2015  . Combined fat and carbohydrate induced hyperlipemia 03/08/2015  . Abnormal LFTs 07/20/2014  . Alcohol drinker 07/20/2014  . Anxiety 07/20/2014  . Arthritis 07/20/2014  . Pneumonia, organism unspecified(486) 07/20/2014  . Clinical depression 07/20/2014  . Acid reflux 07/20/2014  . HLD (hyperlipidemia) 07/20/2014  . BP (high blood pressure) 07/20/2014  . Hemorrhoid 07/20/2014  . Avitaminosis D 07/20/2014  . Panic attack 07/20/2014  . Anxiety disorder 07/20/2014  . Essential (primary) hypertension 07/20/2014  . Other specified health status 07/20/2014    Past Medical History:  Diagnosis Date  . Abnormal LFTs    (Pt told to cut back on alcohol and tylenol.)  . Anxiety   . Arthritis   . Depression   . GERD (gastroesophageal reflux disease)   . Hemorrhoids   . Hyperlipidemia   . Hypertension   . Vitamin D deficiency     Social History   Socioeconomic History  . Marital status: Single    Spouse name: N/A  . Number of children: 0  . Years of education: some college  . Highest education level: Not on file  Occupational History    Employer: [employer]  Social Needs  . Financial resource strain: Not on file  .  Food insecurity:    Worry: Not on file    Inability: Not on file  . Transportation needs:    Medical: Not on file    Non-medical: Not on file  Tobacco Use  . Smoking status: Current Every Day Smoker    Packs/day: 0.25    Years: 30.00    Pack years: 7.50    Types: Cigarettes  . Smokeless tobacco: Never Used  Substance and Sexual Activity  . Alcohol use: Yes    Comment: drinks 2 to 3 beers 2 to 3 times a week.  . Drug use: No  . Sexual activity: Never  Lifestyle  . Physical activity:    Days per week: Not on file    Minutes per session: Not on file  . Stress: Not on file  Relationships  . Social connections:    Talks on phone: Not on file    Gets together: Not on file    Attends religious service: Not on file    Active member of club or organization: Not on file    Attends meetings of clubs or organizations: Not on file    Relationship status: Not on file  . Intimate partner violence:    Fear of current or ex partner: Not on file    Emotionally abused: Not on file    Physically abused: Not on file    Forced sexual activity: Not on file  Other Topics Concern  . Not on file  Social History Narrative  . Not on file    Outpatient Encounter Medications as of 02/07/2018  Medication Sig Note  . aspirin EC 81 MG EC tablet Take 1 tablet (81 mg total) by mouth daily.   . Cholecalciferol (VITAMIN D) 2000 UNITS tablet Take by mouth. 07/20/2014: Received from: [employer]  . clonazePAM (KLONOPIN) 1 MG tablet TAKE ONE TABLET BY MOUTH TWO TIMES A DAY AS NEEDED FOR ANXIETY   . losartan-hydrochlorothiazide (HYZAAR) 50-12.5 MG tablet TAKE ONE (1) TABLET EACH DAY   . omeprazole (PRILOSEC) 20 MG capsule Take 1 capsule (20 mg total) by mouth daily.   . sertraline (ZOLOFT) 100 MG tablet TAKE ONE (1) TABLET EACH DAY 06/02/2017: Patient states that 100 mg makes her fall asleep during the day and she is only taking 1/2 daily.  . simvastatin (ZOCOR) 40 MG tablet TAKE ONE (1) TABLET  EACH DAY    No facility-administered encounter medications on file as of 02/07/2018.     Allergies  Allergen Reactions  . Atorvastatin Diarrhea  . Oxycodone Nausea And Vomiting    Mental changes  . Penicillins Other (See Comments)    Review of Systems  Constitutional: Negative for fever and malaise/fatigue.  HENT: Negative.   Eyes: Negative.   Respiratory: Negative for cough, shortness of breath and wheezing.   Cardiovascular: Negative for chest pain, palpitations, orthopnea and leg swelling.  Gastrointestinal: Negative.   Skin: Negative.   Endo/Heme/Allergies: Negative.   Psychiatric/Behavioral: Negative.     Objective:  BP (!) 142/78 (BP Location: Right Arm, Patient Position: Sitting, Cuff Size: Normal)   Pulse 91   Temp 97.9 F (36.6 C) (Oral)   Resp 16   Wt 146 lb (66.2 kg)   SpO2 99%   BMI 22.20 kg/m   Physical Exam  Constitutional: She is oriented to person, place, and time and well-developed, well-nourished, and in no distress.  HENT:  Head: Normocephalic and atraumatic.  Right Ear: External ear normal.  Left Ear: External ear normal.  Nose: Nose normal.  Eyes: Conjunctivae are normal.  Neck: No thyromegaly present.  Cardiovascular: Normal rate, regular rhythm and normal heart sounds.  Pulmonary/Chest: Effort normal and breath sounds normal.  Abdominal: Soft.  Musculoskeletal:        General: No edema.     Comments: Trace left ankle edema.  Neurological: She is alert and oriented to person, place, and time. Gait normal. GCS score is 15.  Skin: Skin is warm and dry.  Psychiatric: Mood, memory, affect and judgment normal.    Assessment and Plan :   1. Essential (primary) hypertension   2. Abnormal LFTs Pt advised to limit/stop alcohol.  3. Hyperlipidemia, unspecified hyperlipidemia type   4. Gastroesophageal reflux disease without esophagitis Pt says she needs for symptoms. - omeprazole (PRILOSEC) 20 MG capsule; Take 1 capsule (20 mg total) by  mouth daily.  Dispense: 30 capsule; Refill: 12  5. Stenosis of left carotid artery Pt advised to quit smoking.  6. Alcohol drinker  I have done the exam and reviewed the chart and it is accurate to the best of my knowledge. Dentist has been used and  any errors in dictation or transcription are unintentional. Julieanne Manson M.D. [facility]

## 2018-02-23 ENCOUNTER — Other Ambulatory Visit: Payer: Self-pay | Admitting: Family Medicine

## 2018-02-23 DIAGNOSIS — F419 Anxiety disorder, unspecified: Secondary | ICD-10-CM

## 2018-04-20 ENCOUNTER — Other Ambulatory Visit: Payer: Self-pay | Admitting: Family Medicine

## 2018-06-17 ENCOUNTER — Other Ambulatory Visit: Payer: Self-pay | Admitting: Family Medicine

## 2018-06-17 DIAGNOSIS — F419 Anxiety disorder, unspecified: Secondary | ICD-10-CM

## 2018-06-17 DIAGNOSIS — I10 Essential (primary) hypertension: Secondary | ICD-10-CM

## 2018-06-17 NOTE — Telephone Encounter (Signed)
Please review

## 2018-07-18 ENCOUNTER — Other Ambulatory Visit: Payer: Self-pay | Admitting: Physician Assistant

## 2018-07-18 ENCOUNTER — Other Ambulatory Visit: Payer: Self-pay | Admitting: Family Medicine

## 2018-07-18 DIAGNOSIS — I10 Essential (primary) hypertension: Secondary | ICD-10-CM

## 2018-07-18 NOTE — Telephone Encounter (Signed)
Pharmacy requesting refills. Thanks!  

## 2018-07-19 ENCOUNTER — Other Ambulatory Visit: Payer: Self-pay | Admitting: Physician Assistant

## 2018-07-19 DIAGNOSIS — F419 Anxiety disorder, unspecified: Secondary | ICD-10-CM

## 2018-08-08 ENCOUNTER — Ambulatory Visit: Payer: Self-pay | Admitting: Family Medicine

## 2018-08-18 ENCOUNTER — Other Ambulatory Visit: Payer: Self-pay | Admitting: Family Medicine

## 2018-08-18 DIAGNOSIS — F419 Anxiety disorder, unspecified: Secondary | ICD-10-CM

## 2018-08-18 NOTE — Telephone Encounter (Signed)
Pharmacy requesting refills. Thanks!  

## 2018-09-14 ENCOUNTER — Other Ambulatory Visit: Payer: Self-pay | Admitting: Family Medicine

## 2018-09-14 DIAGNOSIS — K219 Gastro-esophageal reflux disease without esophagitis: Secondary | ICD-10-CM

## 2018-09-19 ENCOUNTER — Other Ambulatory Visit: Payer: Self-pay | Admitting: Family Medicine

## 2018-09-19 DIAGNOSIS — F419 Anxiety disorder, unspecified: Secondary | ICD-10-CM

## 2018-09-19 NOTE — Telephone Encounter (Signed)
Pharmacy requesting refills. Thanks!  

## 2019-01-12 ENCOUNTER — Other Ambulatory Visit: Payer: Self-pay

## 2019-01-12 DIAGNOSIS — Z20822 Contact with and (suspected) exposure to covid-19: Secondary | ICD-10-CM

## 2019-01-14 ENCOUNTER — Telehealth: Payer: Self-pay

## 2019-01-14 NOTE — Telephone Encounter (Signed)
Pt called for covid results advised that results are not back 

## 2019-01-15 LAB — NOVEL CORONAVIRUS, NAA: SARS-CoV-2, NAA: NOT DETECTED

## 2019-07-10 ENCOUNTER — Other Ambulatory Visit: Payer: Self-pay | Admitting: Family Medicine

## 2019-07-10 DIAGNOSIS — I10 Essential (primary) hypertension: Secondary | ICD-10-CM

## 2019-07-27 ENCOUNTER — Other Ambulatory Visit: Payer: Self-pay | Admitting: Family Medicine

## 2019-07-27 DIAGNOSIS — F419 Anxiety disorder, unspecified: Secondary | ICD-10-CM

## 2019-07-27 NOTE — Telephone Encounter (Signed)
Medication Refill - Medication: clonazePAM (KLONOPIN) 1 MG tablet  Pt is almost out of her Rx, pt says she spoke with Melanie Mitchell regarding this   Has the patient contacted their pharmacy? Yes.   (Agent: If no, request that the patient contact the pharmacy for the refill.) (Agent: If yes, when and what did the pharmacy advise?)  Preferred Pharmacy (with phone number or street name):  [id number] [place], [place] - [address] AT [facility] OF [address]  [address] [place] [address]  Phone: [phone] Fax: [phone]     Agent: Please be advised that RX refills may take up to 3 business days. We ask that you follow-up with your pharmacy.

## 2019-07-27 NOTE — Telephone Encounter (Signed)
Requested medication (s) are due for refill today: Yes  Requested medication (s) are on the active medication list: Yes  Last refill:  09/20/18  Future visit scheduled: Yes  Notes to clinic:  See request.    Requested Prescriptions  Pending Prescriptions Disp Refills   clonazePAM (KLONOPIN) 1 MG tablet 60 tablet 0    Sig: Take 1 tablet (1 mg total) by mouth 2 (two) times daily as needed. for anxiety      Not Delegated - Psychiatry:  Anxiolytics/Hypnotics Failed - 07/27/2019  3:34 PM      Failed - This refill cannot be delegated      Failed - Urine Drug Screen completed in last 360 days.      Failed - Valid encounter within last 6 months    Recent Outpatient Visits           1 year ago Essential (primary) hypertension   [facility] Maple Hudson., MD   2 years ago Annual physical exam   [facility] Maple Hudson., MD   2 years ago Bronchitis   [facility] Osvaldo Angst M, [place]   2 years ago Anxiety disorder, unspecified type   [facility] Maple Hudson., MD   3 years ago Annual physical exam   [facility] Maple Hudson., MD       Future Appointments             In 3 weeks Maple Hudson., MD [facility]

## 2019-07-28 ENCOUNTER — Other Ambulatory Visit: Payer: Self-pay | Admitting: Family Medicine

## 2019-07-28 DIAGNOSIS — F419 Anxiety disorder, unspecified: Secondary | ICD-10-CM

## 2019-07-28 NOTE — Telephone Encounter (Signed)
Requested  medications are  due for refill today yes  Requested medications are on the active medication list yes  Last refill 09/2018  Future visit scheduled no  Last visit over a year ago  Notes to clinic Not Delegated

## 2019-07-29 NOTE — Telephone Encounter (Signed)
Rajvi has an appointment made

## 2019-07-31 MED ORDER — CLONAZEPAM 1 MG PO TABS: 1.0000 mg | ORAL_TABLET | Freq: Two times a day (BID) | ORAL | 0 refills | Status: DC | PRN

## 2019-07-31 NOTE — Telephone Encounter (Signed)
Medication refill sent to pharmacy  

## 2019-07-31 NOTE — Telephone Encounter (Signed)
Pt called and is requesting to have an update on having this medication sent to the pharmacy. Please advise.

## 2019-07-31 NOTE — Telephone Encounter (Signed)
Patient was seen in January, has appointment the beginning of July

## 2019-07-31 NOTE — Addendum Note (Signed)
Addended by: Aleene Davidson on: 07/31/2019 03:48 PM   Modules accepted: Orders

## 2019-07-31 NOTE — Telephone Encounter (Signed)
Please advise? Patient called in requesting refill and has an appointment scheduled for 7/21.

## 2019-08-07 ENCOUNTER — Other Ambulatory Visit: Payer: Self-pay | Admitting: Family Medicine

## 2019-08-07 DIAGNOSIS — I10 Essential (primary) hypertension: Secondary | ICD-10-CM

## 2019-08-08 NOTE — Telephone Encounter (Signed)
Requested Prescriptions  Pending Prescriptions Disp Refills  . losartan-hydrochlorothiazide (HYZAAR) 50-12.5 MG tablet [Pharmacy Med Name: LOSARTAN/HCTZ 50/12.5MG  TABLETS] 30 tablet 0    Sig: TAKE 1 TABLET BY MOUTH EVERY DAY     Cardiovascular: ARB + Diuretic Combos Failed - 08/07/2019  3:45 AM      Failed - K in normal range and within 180 days    Potassium  Date Value Ref Range Status  11/29/2015 4.0 3.5 - 5.2 mmol/L Final         Failed - Na in normal range and within 180 days    Sodium  Date Value Ref Range Status  11/29/2015 137 134 - 144 mmol/L Final         Failed - Cr in normal range and within 180 days    Creatinine, Ser  Date Value Ref Range Status  11/29/2015 0.83 0.57 - 1.00 mg/dL Final         Failed - Ca in normal range and within 180 days    Calcium  Date Value Ref Range Status  11/29/2015 9.6 8.7 - 10.3 mg/dL Final         Failed - Last BP in normal range    BP Readings from Last 1 Encounters:  02/07/18 (!) 142/78         Failed - Valid encounter within last 6 months    Recent Outpatient Visits          1 year ago Essential (primary) hypertension   [facility] Maple Hudson., MD   2 years ago Annual physical exam   [facility] Maple Hudson., MD   2 years ago Bronchitis   [facility] Osvaldo Angst M, [place]   2 years ago Anxiety disorder, unspecified type   [facility] Maple Hudson., MD   3 years ago Annual physical exam   [facility] Maple Hudson., MD      Future Appointments            In 2 weeks Maple Hudson., MD [facility], [facility]           Passed - Patient is not pregnant

## 2019-08-10 ENCOUNTER — Encounter: Payer: 59 | Admitting: Family Medicine

## 2019-08-21 NOTE — Progress Notes (Deleted)
Complete physical exam   Patient: Melanie Mitchell   DOB: 05-23-1955   64 y.o. Female  MRN: [id number] Visit Date: 08/23/2019  Today's healthcare provider: Megan Mans, MD   No chief complaint on file.  Subjective    Melanie Mitchell is a 64 y.o. female who presents today for a complete physical exam.  She reports consuming a {diet types:17450} diet. {Exercise:19826} She generally feels {well/fairly well/poorly:18703}. She reports sleeping {well/fairly well/poorly:18703}. She {does/does not:200015} have additional problems to discuss today.  HPI   Last mammogram: 08/18/2011 Last pap smear: 02/21/2015  Past Medical History:  Diagnosis Date  . Abnormal LFTs    (Pt told to cut back on alcohol and tylenol.)  . Anxiety   . Arthritis   . Depression   . GERD (gastroesophageal reflux disease)   . Hemorrhoids   . Hyperlipidemia   . Hypertension   . Vitamin D deficiency    Past Surgical History:  Procedure Laterality Date  . ANKLE FRACTURE SURGERY  2008   steel plate and screws put in  . ENDARTERECTOMY Left 04/05/2015   Procedure: ENDARTERECTOMY CAROTID;  Surgeon: Renford Dills, MD;  Location: [facility] ORS;  Service: Vascular;  Laterality: Left;  . stone in the salitory gland  2004  . TONSILLECTOMY  1960  . 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 Marital status: Single    Spouse name: N/A  . Number of children: 0  . Years of education: some college  . Highest education level: Not on file  Occupational History    Employer: [employer]  Tobacco Use  . Smoking status: Current Every Day Smoker    Packs/day: 0.25    Years: 30.00    Pack years: 7.50    Types: Cigarettes  . Smokeless tobacco: Never Used  Substance and Sexual Activity  . Alcohol use: Yes    Comment: drinks 2 to 3 beers 2 to 3 times a week.  . Drug use: No  . Sexual activity: Never  Other Topics Concern  . Not on file  Social  History Narrative  . Not on file   Social Determinants of Health   Financial Resource Strain:   . Difficulty of Paying Living Expenses:   Food Insecurity:   . Worried About Programme researcher, broadcasting/film/video in the Last Year:   . Barista in the Last Year:   Transportation Needs:   . Freight forwarder (Medical):   Marland Kitchen Lack of Transportation (Non-Medical):   Physical Activity:   . Days of Exercise per Week:   . Minutes of Exercise per Session:   Stress:   . Feeling of Stress :   Social Connections:   . Frequency of Communication with Friends and Family:   . Frequency of Social Gatherings with Friends and Family:   . Attends Religious Services:   . Active Member of Clubs or Organizations:   . Attends Banker Meetings:   Marland Kitchen Marital Status:   Intimate Partner Violence:   . Fear of Current or Ex-Partner:   . Emotionally Abused:   Marland Kitchen Physically Abused:   . Sexually Abused:    Family Status  Relation Name Status  . Mother  Deceased  . Father  Deceased at age 57       COPD  . [employer]  . MGM  Deceased  MI  . MGF  Deceased       Cirrhosis of the liver and he may have had pancreatitis   Family History  Problem Relation Age of Onset  . Anxiety disorder Mother   . Hypertension Mother   . Hypercholesterolemia Mother   . Cancer Mother        Non Hodgkins Lymphoma  . Cancer Father        Prostate Cancer  . Multiple sclerosis Maternal Aunt        diagnose at age 87   Allergies  Allergen Reactions  . Atorvastatin Diarrhea  . Oxycodone Nausea And Vomiting    Mental changes  . Penicillins Other (See Comments)    Patient Care Team: Maple Hudson., MD as PCP - General (Family Medicine)   Medications: Outpatient Medications Prior to Visit  Medication Sig  . aspirin EC 81 MG EC tablet Take 1 tablet (81 mg total) by mouth daily.  . Cholecalciferol (VITAMIN D) 2000 UNITS tablet Take by mouth.  . clonazePAM (KLONOPIN) 1 MG tablet Take 1 tablet (1  mg total) by mouth 2 (two) times daily as needed. for anxiety  . losartan-hydrochlorothiazide (HYZAAR) 50-12.5 MG tablet TAKE 1 TABLET BY MOUTH EVERY DAY  . omeprazole (PRILOSEC) 20 MG capsule TAKE 1 CAPSULE BY MOUTH EVERY DAY  . sertraline (ZOLOFT) 100 MG tablet TAKE 1 TABLET BY MOUTH EVERY DAY  . simvastatin (ZOCOR) 40 MG tablet TAKE 1 TABLET BY MOUTH EVERY DAY   No facility-administered medications prior to visit.    Review of Systems  {Heme  Chem  Endocrine  Serology  Results Review (optional):23779::" "}  Objective    There were no vitals taken for this visit. {Show previous vital signs (optional):23777::" "}  Physical Exam  ***  Last depression screening scores PHQ 2/9 Scores 06/02/2017 03/31/2017 05/13/2016  PHQ - 2 Score 2 3 2   PHQ- 9 Score 6 12 8    Last fall risk screening Fall Risk  06/02/2017  Falls in the past year? No   Last Audit-C alcohol use screening No flowsheet data found. A score of 3 or more in women, and 4 or more in men indicates increased risk for alcohol abuse, EXCEPT if all of the points are from question 1   No results found for any visits on 08/23/19.  Assessment & Plan    Routine Health Maintenance and Physical Exam  Exercise Activities and Dietary recommendations Goals   None     Immunization History  Administered Date(s) Administered  . Influenza Whole 11/12/2015  . Influenza,inj,Quad PF,6+ Mos 11/21/2014, 10/08/2016  . Pneumococcal Polysaccharide-23 08/15/2012  . Tdap 08/15/2012    Health Maintenance  Topic Date Due  . Hepatitis C Screening  Never done  . COVID-19 Vaccine (1) Never done  . HIV Screening  Never done  . COLONOSCOPY  Never done  . MAMMOGRAM  08/17/2013  . PAP SMEAR-Modifier  02/20/2018  . INFLUENZA VACCINE  09/03/2019  . TETANUS/TDAP  08/16/2022    Discussed health benefits of physical activity, and encouraged her to engage in regular exercise appropriate for her age and condition.  ***  No follow-ups on  file.     {provider attestation***:1}   11/03/2019, MD  [facility] [phone] (phone) [phone] (fax)  [facility]

## 2019-08-23 ENCOUNTER — Encounter: Payer: 59 | Admitting: Family Medicine

## 2019-09-10 ENCOUNTER — Other Ambulatory Visit: Payer: Self-pay | Admitting: Family Medicine

## 2019-09-10 DIAGNOSIS — I10 Essential (primary) hypertension: Secondary | ICD-10-CM

## 2019-09-10 NOTE — Telephone Encounter (Signed)
Requested Prescriptions  Pending Prescriptions Disp Refills  . losartan-hydrochlorothiazide (HYZAAR) 50-12.5 MG tablet [Pharmacy Med Name: LOSARTAN/HCTZ 50/12.5MG  TABLETS] 17 tablet 0    Sig: TAKE 1 TABLET BY MOUTH EVERY DAY     Cardiovascular: ARB + Diuretic Combos Failed - 09/10/2019  9:01 AM      Failed - K in normal range and within 180 days    Potassium  Date Value Ref Range Status  11/29/2015 4.0 3.5 - 5.2 mmol/L Final         Failed - Na in normal range and within 180 days    Sodium  Date Value Ref Range Status  11/29/2015 137 134 - 144 mmol/L Final         Failed - Cr in normal range and within 180 days    Creatinine, Ser  Date Value Ref Range Status  11/29/2015 0.83 0.57 - 1.00 mg/dL Final         Failed - Ca in normal range and within 180 days    Calcium  Date Value Ref Range Status  11/29/2015 9.6 8.7 - 10.3 mg/dL Final         Failed - Last BP in normal range    BP Readings from Last 1 Encounters:  02/07/18 (!) 142/78         Failed - Valid encounter within last 6 months    Recent Outpatient Visits          1 year ago Essential (primary) hypertension   [facility] Maple Hudson., MD   2 years ago Annual physical exam   [facility] Maple Hudson., MD   2 years ago Bronchitis   [facility] Osvaldo Angst M, [place]   2 years ago Anxiety disorder, unspecified type   [facility] Maple Hudson., MD   3 years ago Annual physical exam   [facility] Maple Hudson., MD      Future Appointments            In 2 weeks Maple Hudson., MD [facility], [facility]           Passed - Patient is not pregnant

## 2019-09-25 ENCOUNTER — Telehealth: Payer: Self-pay

## 2019-09-25 NOTE — Telephone Encounter (Signed)
[facility]  ED   Copied from CRM [id number]. Topic: General - Inquiry >> Sep 25, 2019  1:06 PM Angela Nevin wrote: Patient requesting to speak with Elana regarding CPE appointment on Wednesday

## 2019-09-26 NOTE — Progress Notes (Signed)
Complete physical exam   Patient: Melanie Mitchell   DOB: 12/08/1955   64 y.o. Female  MRN: [id number] Visit Date: 09/27/2019  Today's healthcare provider: Megan Mans, MD   Chief Complaint  Patient presents with  . Annual Exam   Subjective    Melanie Mitchell is a 64 y.o. female who presents today for a complete physical exam.  She reports consuming a general diet. Home exercise routine includes none. She generally feels well. She reports sleeping well. She does have additional problems to discuss today.  She continues to smoke 1/4 pack/day and has for many years.  She is interested in NicoDerm patch.  She drinks about 3 drinks of alcohol per day now. 02/21/2015 Pap with HPV-normal and negative 08/20/2011 BMD-Osteopenia Mammogram-none on file Colonoscopy-none on file  Patient would like a prescription for smoking cessation Patient would like shingles vaccine    Immunization History  Administered Date(s) Administered  . Influenza Whole 11/12/2015  . Influenza,inj,Quad PF,6+ Mos 11/21/2014, 10/08/2016  . Pneumococcal Polysaccharide-23 08/15/2012  . Tdap 08/15/2012     Past Medical History:  Diagnosis Date  . Abnormal LFTs    (Pt told to cut back on alcohol and tylenol.)  . Anxiety   . Arthritis   . Depression   . GERD (gastroesophageal reflux disease)   . Hemorrhoids   . Hyperlipidemia   . Hypertension   . Vitamin D deficiency    Past Surgical History:  Procedure Laterality Date  . ANKLE FRACTURE SURGERY  2008   steel plate and screws put in  . ENDARTERECTOMY Left 04/05/2015   Procedure: ENDARTERECTOMY CAROTID;  Surgeon: Renford Dills, MD;  Location: [facility] ORS;  Service: Vascular;  Laterality: Left;  . stone in the salitory gland  2004  . TONSILLECTOMY  1960  . 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 Marital status: Single    Spouse name: N/A  . Number of children: 0  . Years of  education: some college  . Highest education level: Not on file  Occupational History    Employer: [employer]  Tobacco Use  . Smoking status: Current Every Day Smoker    Packs/day: 0.25    Years: 30.00    Pack years: 7.50    Types: Cigarettes  . Smokeless tobacco: Never Used  Substance and Sexual Activity  . Alcohol use: Yes    Comment: drinks 2 to 3 beers 2 to 3 times a week.  . Drug use: No  . Sexual activity: Never  Other Topics Concern  . Not on file  Social History Narrative  . Not on file   Social Determinants of Health   Financial Resource Strain:   . Difficulty of Paying Living Expenses: Not on file  Food Insecurity:   . Worried About Programme researcher, broadcasting/film/video in the Last Year: Not on file  . Ran Out of Food in the Last Year: Not on file  Transportation Needs:   . Lack of Transportation (Medical): Not on file  . Lack of Transportation (Non-Medical): Not on file  Physical Activity:   . Days of Exercise per Week: Not on file  . Minutes of Exercise per Session: Not on file  Stress:   . Feeling of Stress : Not on file  Social Connections:   . Frequency of Communication with Friends and Family: Not on file  . Frequency of Social Gatherings with  Friends and Family: Not on file  . Attends Religious Services: Not on file  . Active Member of Clubs or Organizations: Not on file  . Attends BankerClub or Organization Meetings: Not on file  . Marital Status: Not on file  Intimate Partner Violence:   . Fear of Current or Ex-Partner: Not on file  . Emotionally Abused: Not on file  . Physically Abused: Not on file  . Sexually Abused: Not on file   Family Status  Relation Name Status  . Mother  Deceased  . Father  Deceased at age 65       COPD  . [employer]  Alive  . MGM  Deceased       MI  . MGF  Deceased       Cirrhosis of the liver and he may have had pancreatitis   Family History  Problem Relation Age of Onset  . Anxiety disorder Mother   . Hypertension Mother   .  Hypercholesterolemia Mother   . Cancer Mother        Non Hodgkins Lymphoma  . Cancer Father        Prostate Cancer  . Multiple sclerosis Maternal Aunt        diagnose at age 65   Allergies  Allergen Reactions  . Atorvastatin Diarrhea  . Oxycodone Nausea And Vomiting    Mental changes  . Penicillins Other (See Comments)    Patient Care Team: Maple HudsonGilbert, Brennen Gardiner L Jr., MD as PCP - General (Family Medicine)   Medications: Outpatient Medications Prior to Visit  Medication Sig  . aspirin EC 81 MG EC tablet Take 1 tablet (81 mg total) by mouth daily.  . Cholecalciferol (VITAMIN D) 2000 UNITS tablet Take by mouth.  . clonazePAM (KLONOPIN) 1 MG tablet Take 1 tablet (1 mg total) by mouth 2 (two) times daily as needed. for anxiety  . losartan-hydrochlorothiazide (HYZAAR) 50-12.5 MG tablet TAKE 1 TABLET BY MOUTH EVERY DAY  . omeprazole (PRILOSEC) 20 MG capsule TAKE 1 CAPSULE BY MOUTH EVERY DAY  . sertraline (ZOLOFT) 100 MG tablet TAKE 1 TABLET BY MOUTH EVERY DAY  . simvastatin (ZOCOR) 40 MG tablet TAKE 1 TABLET BY MOUTH EVERY DAY   No facility-administered medications prior to visit.    Review of Systems  Constitutional: Negative.   HENT: Positive for hearing loss.   Eyes: Positive for visual disturbance.  Respiratory: Negative.   Cardiovascular: Negative.   Gastrointestinal: Negative.   Endocrine: Negative.   Genitourinary: Negative.   Musculoskeletal: Positive for arthralgias.  Skin: Negative.   Allergic/Immunologic: Negative.   Neurological: Negative.   Hematological: Negative.   Psychiatric/Behavioral: The patient is nervous/anxious.        Objective    BP 104/60 (BP Location: Left Arm, Patient Position: Sitting, Cuff Size: Normal)   Pulse (!) 111   Temp 98.2 F (36.8 C) (Oral)   Ht 5' 6.75" (1.695 m)   Wt 135 lb (61.2 kg)   SpO2 98%   BMI 21.30 kg/m     Physical Exam Constitutional:      Appearance: Normal appearance. She is normal weight.  HENT:     Head:  Normocephalic and atraumatic.     Right Ear: Tympanic membrane, ear canal and external ear normal.     Left Ear: Tympanic membrane, ear canal and external ear normal.     Nose: Nose normal.     Mouth/Throat:     Mouth: Mucous membranes are moist.     Pharynx: Oropharynx  is clear.  Eyes:     Extraocular Movements: Extraocular movements intact.     Conjunctiva/sclera: Conjunctivae normal.     Pupils: Pupils are equal, round, and reactive to light.  Cardiovascular:     Rate and Rhythm: Normal rate and regular rhythm.     Pulses: Normal pulses.     Heart sounds: Normal heart sounds.  Pulmonary:     Effort: Pulmonary effort is normal.     Breath sounds: Normal breath sounds.  Chest:     Breasts:        Right: Normal.        Left: Normal.  Abdominal:     General: Abdomen is flat. Bowel sounds are normal.     Palpations: Abdomen is soft.  Musculoskeletal:     Cervical back: Normal range of motion and neck supple.     Right lower leg: No edema.     Left lower leg: No edema.  Skin:    General: Skin is warm and dry.  Neurological:     General: No focal deficit present.     Mental Status: She is alert and oriented to person, place, and time. Mental status is at baseline.  Psychiatric:        Mood and Affect: Mood normal.        Behavior: Behavior normal.        Thought Content: Thought content normal.        Judgment: Judgment normal.      Last depression screening scores PHQ 2/9 Scores 06/02/2017 03/31/2017 05/13/2016  PHQ - 2 Score 2 3 2   PHQ- 9 Score 6 12 8    Last fall risk screening Fall Risk  06/02/2017  Falls in the past year? No   Last Audit-C alcohol use screening Alcohol Use Disorder Test (AUDIT) 09/27/2019  1. How often do you have a drink containing alcohol? 3  2. How many drinks containing alcohol do you have on a typical day when you are drinking? 0  3. How often do you have six or more drinks on one occasion? 0  AUDIT-C Score 3  Alcohol Brief  Interventions/Follow-up AUDIT Score <7 follow-up not indicated   A score of 3 or more in women, and 4 or more in men indicates increased risk for alcohol abuse, EXCEPT if all of the points are from question 1   No results found for any visits on 09/27/19.  Assessment & Plan    Routine Health Maintenance and Physical Exam  Exercise Activities and Dietary recommendations Goals   None     Immunization History  Administered Date(s) Administered  . Influenza Whole 11/12/2015  . Influenza,inj,Quad PF,6+ Mos 11/21/2014, 10/08/2016  . Pneumococcal Polysaccharide-23 08/15/2012  . Tdap 08/15/2012    Health Maintenance  Topic Date Due  . Hepatitis C Screening  Never done  . COVID-19 Vaccine (1) Never done  . HIV Screening  Never done  . COLONOSCOPY  Never done  . MAMMOGRAM  08/17/2013  . PAP SMEAR-Modifier  02/20/2018  . INFLUENZA VACCINE  09/03/2019  . TETANUS/TDAP  08/16/2022    Discussed health benefits of physical activity, and encouraged her to engage in regular exercise appropriate for her age and condition. 1. Annual physical exam Patient is agreeable to colonoscopy now that she is selling her house will have some money. Pap smear next year.  Will be last  - CBC with Differential/Platelet - Comprehensive metabolic panel - Lipid Panel With LDL/HDL Ratio - TSH - POCT urinalysis dipstick  2. Encounter for screening mammogram for malignant neoplasm of breast  - MM 3D SCREEN BREAST BILATERAL  3. Colon cancer screening  - Ambulatory referral to Gastroenterology  4. Anxiety disorder, unspecified type Advised patient is time to cut back on the use of clonazepam.  She is taking about 1-1/2/day.  Plan on cutting back dose in the future.  5. Gastroesophageal reflux disease without esophagitis  - omeprazole (PRILOSEC) 20 MG capsule; TAKE 1 CAPSULE BY MOUTH EVERY DAY  Dispense: 90 capsule; Refill: 3  6. Stenosis of left carotid artery Strongly advised to quit smoking.  May  need carotid Dopplers.  7. Hyperlipidemia, unspecified hyperlipidemia type Simvastatin  8. Alcohol drinker Patient advised to cut back to 1 daily  9. Tobacco abuse NicoDerm patches written for 14 mg daily.     No follow-ups on file.     I, Megan Mans, MD, have reviewed all documentation for this visit. The documentation on 09/29/19 for the exam, diagnosis, procedures, and orders are all accurate and complete.    Wreatha Sturgeon Wendelyn Breslow, MD  [facility] [phone] (phone) [phone] (fax)  [facility]

## 2019-09-27 ENCOUNTER — Other Ambulatory Visit: Payer: Self-pay

## 2019-09-27 ENCOUNTER — Ambulatory Visit (INDEPENDENT_AMBULATORY_CARE_PROVIDER_SITE_OTHER): Payer: 59 | Admitting: Family Medicine

## 2019-09-27 VITALS — BP 104/60 | HR 111 | Temp 98.2°F | Ht 66.75 in | Wt 135.0 lb

## 2019-09-27 DIAGNOSIS — I6522 Occlusion and stenosis of left carotid artery: Secondary | ICD-10-CM

## 2019-09-27 DIAGNOSIS — Z0000 Encounter for general adult medical examination without abnormal findings: Secondary | ICD-10-CM

## 2019-09-27 DIAGNOSIS — Z1231 Encounter for screening mammogram for malignant neoplasm of breast: Secondary | ICD-10-CM

## 2019-09-27 DIAGNOSIS — Z7289 Other problems related to lifestyle: Secondary | ICD-10-CM

## 2019-09-27 DIAGNOSIS — E785 Hyperlipidemia, unspecified: Secondary | ICD-10-CM

## 2019-09-27 DIAGNOSIS — F419 Anxiety disorder, unspecified: Secondary | ICD-10-CM

## 2019-09-27 DIAGNOSIS — K219 Gastro-esophageal reflux disease without esophagitis: Secondary | ICD-10-CM

## 2019-09-27 DIAGNOSIS — Z789 Other specified health status: Secondary | ICD-10-CM

## 2019-09-27 DIAGNOSIS — Z720 Tobacco use: Secondary | ICD-10-CM

## 2019-09-27 DIAGNOSIS — Z1211 Encounter for screening for malignant neoplasm of colon: Secondary | ICD-10-CM | POA: Diagnosis not present

## 2019-09-27 LAB — POCT URINALYSIS DIPSTICK
Bilirubin, UA: NEGATIVE
Blood, UA: NEGATIVE
Glucose, UA: NEGATIVE
Leukocytes, UA: NEGATIVE
Nitrite, UA: NEGATIVE
Protein, UA: NEGATIVE
Spec Grav, UA: 1.02 (ref 1.010–1.025)
Urobilinogen, UA: 0.2 E.U./dL
pH, UA: 6 (ref 5.0–8.0)

## 2019-09-27 MED ORDER — OMEPRAZOLE 20 MG PO CPDR: DELAYED_RELEASE_CAPSULE | ORAL | 3 refills | Status: DC

## 2019-09-28 ENCOUNTER — Other Ambulatory Visit: Payer: Self-pay

## 2019-09-28 DIAGNOSIS — F419 Anxiety disorder, unspecified: Secondary | ICD-10-CM

## 2019-09-28 LAB — COMPREHENSIVE METABOLIC PANEL
ALT: 17 IU/L (ref 0–32)
AST: 40 IU/L (ref 0–40)
Albumin/Globulin Ratio: 1.9 (ref 1.2–2.2)
Albumin: 4.5 g/dL (ref 3.8–4.8)
Alkaline Phosphatase: 137 IU/L — ABNORMAL HIGH (ref 48–121)
BUN/Creatinine Ratio: 9 — ABNORMAL LOW (ref 12–28)
BUN: 7 mg/dL — ABNORMAL LOW (ref 8–27)
Bilirubin Total: 0.5 mg/dL (ref 0.0–1.2)
CO2: 24 mmol/L (ref 20–29)
Calcium: 9.7 mg/dL (ref 8.7–10.3)
Chloride: 90 mmol/L — ABNORMAL LOW (ref 96–106)
Creatinine, Ser: 0.74 mg/dL (ref 0.57–1.00)
GFR calc Af Amer: 99 mL/min/{1.73_m2} (ref >59)
GFR calc non Af Amer: 86 mL/min/{1.73_m2} (ref >59)
Globulin, Total: 2.4 g/dL (ref 1.5–4.5)
Glucose: 108 mg/dL — ABNORMAL HIGH (ref 65–99)
Potassium: 3.9 mmol/L (ref 3.5–5.2)
Sodium: 132 mmol/L — ABNORMAL LOW (ref 134–144)
Total Protein: 6.9 g/dL (ref 6.0–8.5)

## 2019-09-28 LAB — CBC WITH DIFFERENTIAL/PLATELET
Basophils Absolute: 0 10*3/uL (ref 0.0–0.2)
Basos: 0 %
EOS (ABSOLUTE): 0 10*3/uL (ref 0.0–0.4)
Eos: 0 %
Hematocrit: 41 % (ref 34.0–46.6)
Hemoglobin: 14 g/dL (ref 11.1–15.9)
Immature Grans (Abs): 0 10*3/uL (ref 0.0–0.1)
Immature Granulocytes: 0 %
Lymphocytes Absolute: 1.6 10*3/uL (ref 0.7–3.1)
Lymphs: 19 %
MCH: 29.7 pg (ref 26.6–33.0)
MCHC: 34.1 g/dL (ref 31.5–35.7)
MCV: 87 fL (ref 79–97)
Monocytes Absolute: 0.4 10*3/uL (ref 0.1–0.9)
Monocytes: 5 %
Neutrophils Absolute: 6.1 10*3/uL (ref 1.4–7.0)
Neutrophils: 76 %
Platelets: 276 10*3/uL (ref 150–450)
RBC: 4.71 x10E6/uL (ref 3.77–5.28)
RDW: 12.7 % (ref 11.7–15.4)
WBC: 8.1 10*3/uL (ref 3.4–10.8)

## 2019-09-28 LAB — LIPID PANEL WITH LDL/HDL RATIO
Cholesterol, Total: 160 mg/dL (ref 100–199)
HDL: 68 mg/dL (ref >39)
LDL Chol Calc (NIH): 76 mg/dL (ref 0–99)
LDL/HDL Ratio: 1.1 ratio (ref 0.0–3.2)
Triglycerides: 86 mg/dL (ref 0–149)
VLDL Cholesterol Cal: 16 mg/dL (ref 5–40)

## 2019-09-28 LAB — TSH: TSH: 3.59 u[IU]/mL (ref 0.450–4.500)

## 2019-09-28 MED ORDER — CLONAZEPAM 1 MG PO TABS: 1.0000 mg | ORAL_TABLET | Freq: Two times a day (BID) | ORAL | 2 refills | Status: DC | PRN

## 2019-09-28 NOTE — Telephone Encounter (Signed)
Patient advised of lab results and would like to know if you could please send in her medication because she went straight to the lab yesterday and had her labs drawn.

## 2019-09-28 NOTE — Telephone Encounter (Signed)
-----   Message from Maple Hudson., MD sent at 09/28/2019  1:03 PM EDT ----- Labs in normal range.

## 2019-10-10 ENCOUNTER — Telehealth: Payer: Self-pay

## 2019-10-10 ENCOUNTER — Encounter: Payer: Self-pay | Admitting: *Deleted

## 2019-10-10 NOTE — Telephone Encounter (Signed)
ok 

## 2019-10-10 NOTE — Telephone Encounter (Signed)
Just FYI. Thanks!  

## 2019-10-10 NOTE — Telephone Encounter (Signed)
Copied from CRM [id number]. Topic: General - Other >> Oct 10, 2019  2:55 PM Wyonia Hough E wrote: Reason for CRM: Pt stated to tell dr. Sullivan Lone she can not schedule a colonoscopy  or mammogram until November when her medicare go into effect / her insurance will not cover it/ please advise

## 2019-12-11 ENCOUNTER — Other Ambulatory Visit: Payer: Self-pay | Admitting: Family Medicine

## 2019-12-11 DIAGNOSIS — I10 Essential (primary) hypertension: Secondary | ICD-10-CM

## 2020-03-25 ENCOUNTER — Other Ambulatory Visit: Payer: Self-pay | Admitting: Family Medicine

## 2020-03-25 DIAGNOSIS — F419 Anxiety disorder, unspecified: Secondary | ICD-10-CM

## 2020-03-25 NOTE — Telephone Encounter (Signed)
Requested medication (s) are due for refill today: klonopin- yes , zoloft, zocor expired medications  Requested medication (s) are on the active medication list: yes  Last refill:  klonopin- 09/28/19 #60 2 refill , zoloft- 04/21/2018 #30 12 refills , Zocor - 07/18/2018 #30 10 refills   Future visit scheduled: yes  Notes to clinic:  not delegated per protocol, expired medications. Do you want to renew zoloft and zocor Rx?      Requested Prescriptions  Pending Prescriptions Disp Refills   clonazePAM (KLONOPIN) 1 MG tablet [Pharmacy Med Name: CLONAZEPAM 1MG  TABLETS] 60 tablet     Sig: TAKE 1 TABLET(1 MG) BY MOUTH TWICE DAILY AS NEEDED FOR ANXIETY      Not Delegated - Psychiatry:  Anxiolytics/Hypnotics Failed - 03/25/2020 11:35 AM      Failed - This refill cannot be delegated      Failed - Urine Drug Screen completed in last 360 days      Passed - Valid encounter within last 6 months    Recent Outpatient Visits           6 months ago Annual physical exam   [facility] MD   2 years ago Essential (primary) hypertension   [facility] MD   2 years ago Annual physical exam   [facility] MD   2 years ago Bronchitis   [facility] M, M   2 years ago Anxiety disorder, unspecified type   [facility] MD       Future Appointments             In 1 month Fisher, Maple Hudson, MD [facility]               sertraline (ZOLOFT) 100 MG tablet [Pharmacy Med Name: SERTRALINE 100MG  TABLETS] 30 tablet 12    Sig: TAKE 1 TABLET BY MOUTH EVERY DAY      Psychiatry:  Antidepressants - SSRI Passed - 03/25/2020 11:35 AM      Passed - Completed PHQ-2 or PHQ-9 in the last 360 days      Passed - Valid encounter within last 6 months    Recent Outpatient Visits           6 months ago Annual  physical exam   [facility] 03/27/2020., MD   2 years ago Essential (primary) hypertension   [facility] Maple Hudson., MD   2 years ago Annual physical exam   [facility] Maple Hudson., MD   2 years ago Bronchitis   [facility] Maple Hudson M, Osvaldo Angst   2 years ago Anxiety disorder, unspecified type   [facility] [place]       Future Appointments             In 1 month Fisher, [facility], MD [facility]               simvastatin (ZOCOR) 40 MG tablet [Pharmacy Med Name: SIMVASTATIN 40MG  TABLETS] 30 tablet 10    Sig: TAKE 1 TABLET BY MOUTH EVERY DAY      Cardiovascular:  Antilipid - Statins Failed - 03/25/2020 11:35 AM      Failed - LDL in normal range and within 360 days    LDL Chol Calc (NIH)  Date Value Ref Range Status  09/27/2019 76 0 - 99 mg/dL Final          Passed - Total Cholesterol in normal range and within 360 days    Cholesterol, Total  Date Value Ref Range Status  09/27/2019 160 100 - 199 mg/dL Final          Passed - HDL in normal range and within 360 days    HDL  Date Value Ref Range Status  09/27/2019 68 >39 mg/dL Final          Passed - Triglycerides in normal range and within 360 days    Triglycerides  Date Value Ref Range Status  09/27/2019 86 0 - 149 mg/dL Final          Passed - Patient is not pregnant      Passed - Valid encounter within last 12 months    Recent Outpatient Visits           6 months ago Annual physical exam   [facility] Maple Hudson., MD   2 years ago Essential (primary) hypertension   [facility] Maple Hudson., MD   2 years ago Annual physical exam   [facility] Maple Hudson., MD   2 years ago Bronchitis   [facility] Osvaldo Angst M, [place]   2 years ago Anxiety disorder, unspecified  type   [facility] Maple Hudson., MD       Future Appointments             In 1 month Fisher, Demetrios Isaacs, MD [facility]

## 2020-03-28 NOTE — Telephone Encounter (Signed)
Pt following up on refill request for her meds.  Her appt is not until April 14.  It is her 6 month check up. Pt states if she doesn't get her meds, she will be out of meds this weekend.  Someone accidentally scheduled her an appt with Dr Sherrie Mustache, but that has been corrected.

## 2020-04-04 ENCOUNTER — Ambulatory Visit: Payer: Self-pay | Admitting: Family Medicine

## 2020-05-09 ENCOUNTER — Ambulatory Visit: Payer: 59 | Admitting: Family Medicine

## 2020-05-09 DIAGNOSIS — Z1231 Encounter for screening mammogram for malignant neoplasm of breast: Secondary | ICD-10-CM | POA: Diagnosis not present

## 2020-05-09 LAB — HM MAMMOGRAPHY

## 2020-05-13 ENCOUNTER — Encounter: Payer: Self-pay | Admitting: *Deleted

## 2020-05-14 ENCOUNTER — Ambulatory Visit: Payer: 59 | Admitting: Family Medicine

## 2020-05-16 ENCOUNTER — Encounter: Payer: Self-pay | Admitting: Family Medicine

## 2020-05-16 ENCOUNTER — Ambulatory Visit (INDEPENDENT_AMBULATORY_CARE_PROVIDER_SITE_OTHER): Payer: Medicare Other | Admitting: Family Medicine

## 2020-05-16 ENCOUNTER — Other Ambulatory Visit: Payer: Self-pay

## 2020-05-16 VITALS — BP 144/76 | HR 88 | Temp 97.8°F | Resp 16 | Ht 67.0 in | Wt 143.0 lb

## 2020-05-16 DIAGNOSIS — I10 Essential (primary) hypertension: Secondary | ICD-10-CM

## 2020-05-16 DIAGNOSIS — J189 Pneumonia, unspecified organism: Secondary | ICD-10-CM

## 2020-05-16 DIAGNOSIS — Z789 Other specified health status: Secondary | ICD-10-CM

## 2020-05-16 DIAGNOSIS — E785 Hyperlipidemia, unspecified: Secondary | ICD-10-CM

## 2020-05-16 DIAGNOSIS — Z7289 Other problems related to lifestyle: Secondary | ICD-10-CM | POA: Diagnosis not present

## 2020-05-16 DIAGNOSIS — F419 Anxiety disorder, unspecified: Secondary | ICD-10-CM

## 2020-05-16 DIAGNOSIS — I6522 Occlusion and stenosis of left carotid artery: Secondary | ICD-10-CM | POA: Diagnosis not present

## 2020-05-16 NOTE — Progress Notes (Signed)
I,April Miller,acting as a scribe for Megan Mans, MD.,have documented all relevant documentation on the behalf of Megan Mans, MD,as directed by  Megan Mans, MD while in the presence of Megan Mans, MD.   Established patient visit   Patient: Melanie Mitchell   DOB: [birth year]   65 y.o. Female  MRN: [id number] Visit Date: 05/16/2020  Today's healthcare provider: Megan Mans, MD   Chief Complaint  Patient presents with  . Follow-up  . Hyperlipidemia   Subjective    HPI  Comes in today for follow-up.  She states she is cut back on drinking about twice a week.  She had 3 beers last night.  She overall feels well. Lipid/Cholesterol, follow-up  Last Lipid Panel: Lab Results  Component Value Date   CHOL 160 09/27/2019   LDLCALC 76 09/27/2019   HDL 68 09/27/2019   TRIG 86 09/27/2019    She was last seen for this 6 months ago.  Management since that visit includes; on simvastatin.  She reports good compliance with treatment. She is not having side effects. none She is following a Regular diet. Current exercise: none  Last metabolic panel Lab Results  Component Value Date   GLUCOSE 108 (H) 09/27/2019   NA 132 (L) 09/27/2019   K 3.9 09/27/2019   BUN 7 (L) 09/27/2019   CREATININE 0.74 09/27/2019   GFRNONAA 86 09/27/2019   GFRAA 99 09/27/2019   CALCIUM 9.7 09/27/2019   AST 40 09/27/2019   ALT 17 09/27/2019   The 10-year ASCVD risk score Denman George DC Jr., et al., 2013) is: 13.8%  --------------------------------------------------------------------     Anxiety disorder, unspecified type From 8/85/2021- Advised patient is time to cut back on the use of clonazepam.  She is taking about 1-1/2/day.  Plan on cutting back dose in the future.  Depression screen [facility] 2/9 05/16/2020 09/27/2019 06/02/2017  Decreased Interest 1 2 1   Down, Depressed, Hopeless 1 2 1   PHQ - 2 Score 2 4 2   Altered sleeping 1 1 2   Tired, decreased energy 1 0 1   Change in appetite 1 0 0  Feeling bad or failure about yourself  1 1 1   Trouble concentrating 0 0 0  Moving slowly or fidgety/restless 0 0 0  Suicidal thoughts 0 0 0  PHQ-9 Score 6 6 6   Difficult doing work/chores Not difficult at all Not difficult at all Very difficult        Medications: Outpatient Medications Prior to Visit  Medication Sig  . aspirin EC 81 MG EC tablet Take 1 tablet (81 mg total) by mouth daily.  . Cholecalciferol (VITAMIN D) 2000 UNITS tablet Take by mouth.  . clonazePAM (KLONOPIN) 1 MG tablet TAKE 1 TABLET(1 MG) BY MOUTH TWICE DAILY AS NEEDED FOR ANXIETY  . losartan-hydrochlorothiazide (HYZAAR) 50-12.5 MG tablet TAKE 1 TABLET BY MOUTH EVERY DAY  . omeprazole (PRILOSEC) 20 MG capsule TAKE 1 CAPSULE BY MOUTH EVERY DAY  . sertraline (ZOLOFT) 100 MG tablet TAKE 1 TABLET BY MOUTH EVERY DAY  . simvastatin (ZOCOR) 40 MG tablet TAKE 1 TABLET BY MOUTH EVERY DAY   No facility-administered medications prior to visit.    Review of Systems  Constitutional: Negative for appetite change, chills, fatigue and fever.  Respiratory: Negative for chest tightness and shortness of breath.   Cardiovascular: Negative for chest pain and palpitations.  Gastrointestinal: Negative for abdominal pain, nausea and vomiting.  Neurological: Negative for dizziness and weakness.  Objective    BP (!) 144/76 (BP Location: Right Arm, Patient Position: Sitting, Cuff Size: Normal)   Pulse 88   Temp 97.8 F (36.6 C) (Oral)   Resp 16   Ht 5\' 7"  (1.702 m)   Wt 143 lb (64.9 kg)   SpO2 97%   BMI 22.40 kg/m  BP Readings from Last 3 Encounters:  05/16/20 (!) 144/76  09/27/19 104/60  02/07/18 (!) 142/78   Wt Readings from Last 3 Encounters:  05/16/20 143 lb (64.9 kg)  09/27/19 135 lb (61.2 kg)  02/07/18 146 lb (66.2 kg)       Physical Exam Vitals reviewed.  Constitutional:      Appearance: She is well-developed.  HENT:     Head: Normocephalic and atraumatic.     Right  Ear: External ear normal.     Left Ear: External ear normal.     Nose: Nose normal.  Eyes:     General: No scleral icterus.    Conjunctiva/sclera: Conjunctivae normal.  Neck:     Thyroid: No thyromegaly.  Cardiovascular:     Rate and Rhythm: Normal rate and regular rhythm.     Heart sounds: Normal heart sounds.  Pulmonary:     Effort: Pulmonary effort is normal.     Breath sounds: Normal breath sounds.  Abdominal:     Palpations: Abdomen is soft.  Skin:    General: Skin is warm and dry.  Neurological:     Mental Status: She is alert and oriented to person, place, and time. Mental status is at baseline.  Psychiatric:        Mood and Affect: Mood normal.        Behavior: Behavior normal.        Thought Content: Thought content normal.        Judgment: Judgment normal.       No results found for any visits on 05/16/20.  Assessment & Plan     1. Stenosis of carotid artery Risk factors treated  2. Essential (primary) hypertension Follow home blood pressures  3. Alcohol drinker Patient trying to limit alcohol.  4. Anxiety disorder, unspecified type Clinically stable.  Try to avoid benzodiazepines  5. Hyperlipidemia, unspecified hyperlipidemia type       No follow-ups on file.      I, 05/18/20, MD, have reviewed all documentation for this visit. The documentation on 05/26/20 for the exam, diagnosis, procedures, and orders are all accurate and complete.    Rabia Argote 05/28/20, MD  [facility] [phone] (phone) [phone] (fax)  [facility]

## 2020-06-04 ENCOUNTER — Other Ambulatory Visit: Payer: Self-pay | Admitting: Family Medicine

## 2020-06-04 DIAGNOSIS — I10 Essential (primary) hypertension: Secondary | ICD-10-CM

## 2020-06-04 NOTE — Telephone Encounter (Signed)
  Notes to clinic: Patient has appt scheduled for 10/09/2020 Review for refill until that time    Requested Prescriptions  Pending Prescriptions Disp Refills   losartan-hydrochlorothiazide (HYZAAR) 50-12.5 MG tablet [Pharmacy Med Name: LOSARTAN/HCTZ 50/12.5MG  TABLETS] 90 tablet 1    Sig: TAKE 1 TABLET BY MOUTH EVERY DAY      Cardiovascular: ARB + Diuretic Combos Failed - 06/04/2020  8:10 AM      Failed - K in normal range and within 180 days    Potassium  Date Value Ref Range Status  09/27/2019 3.9 3.5 - 5.2 mmol/L Final          Failed - Na in normal range and within 180 days    Sodium  Date Value Ref Range Status  09/27/2019 132 (L) 134 - 144 mmol/L Final          Failed - Cr in normal range and within 180 days    Creatinine, Ser  Date Value Ref Range Status  09/27/2019 0.74 0.57 - 1.00 mg/dL Final          Failed - Ca in normal range and within 180 days    Calcium  Date Value Ref Range Status  09/27/2019 9.7 8.7 - 10.3 mg/dL Final          Failed - Last BP in normal range    BP Readings from Last 1 Encounters:  05/16/20 (!) 144/76          Passed - Patient is not pregnant      Passed - Valid encounter within last 6 months    Recent Outpatient Visits           2 weeks ago Stenosis of left carotid artery   [facility] Maple Hudson., MD   8 months ago Annual physical exam   [facility] Maple Hudson., MD   2 years ago Essential (primary) hypertension   [facility] Maple Hudson., MD   3 years ago Annual physical exam   [facility] Maple Hudson., MD   3 years ago Bronchitis   [facility] Trey Sailors, [place]       Future Appointments             In 4 months Maple Hudson., MD [facility]

## 2020-06-07 DIAGNOSIS — M19072 Primary osteoarthritis, left ankle and foot: Secondary | ICD-10-CM | POA: Diagnosis not present

## 2020-06-07 DIAGNOSIS — M79672 Pain in left foot: Secondary | ICD-10-CM | POA: Diagnosis not present

## 2020-06-07 DIAGNOSIS — M7989 Other specified soft tissue disorders: Secondary | ICD-10-CM | POA: Diagnosis not present

## 2020-07-16 ENCOUNTER — Telehealth: Payer: Self-pay

## 2020-07-16 NOTE — Telephone Encounter (Signed)
Copied from CRM [id number]. Topic: General - Other >> Jul 15, 2020  5:57 PM Randol Kern wrote: Pt already received her mammogram, is still receiving automated messages to schedule from office number. Wants calls to stop, she received two today.

## 2020-07-28 ENCOUNTER — Other Ambulatory Visit: Payer: Self-pay | Admitting: Family Medicine

## 2020-07-28 DIAGNOSIS — F419 Anxiety disorder, unspecified: Secondary | ICD-10-CM

## 2020-07-28 NOTE — Telephone Encounter (Signed)
Requested medication (s) are due for refill today: yes  Requested medication (s) are on the active medication list: yes  Last refill:  03/28/20  Future visit scheduled: yes  Notes to clinic:  med not delegated to NT to RF   Requested Prescriptions  Pending Prescriptions Disp Refills   clonazePAM (KLONOPIN) 1 MG tablet [Pharmacy Med Name: CLONAZEPAM 1MG  TABLETS] 60 tablet     Sig: TAKE 1 TABLET(1 MG) BY MOUTH TWICE DAILY AS NEEDED FOR ANXIETY      Not Delegated - Psychiatry:  Anxiolytics/Hypnotics Failed - 07/28/2020  3:58 PM      Failed - This refill cannot be delegated      Failed - Urine Drug Screen completed in last 360 days      Passed - Valid encounter within last 6 months    Recent Outpatient Visits           2 months ago Stenosis of left carotid artery   [facility] [place]   10 months ago Annual physical exam   [facility] MD   2 years ago Essential (primary) hypertension   [facility] MD   3 years ago Annual physical exam   [facility] MD   3 years ago Bronchitis   [facility], Trey Sailors       Future Appointments             In 2 months [place]., MD [facility], [facility]

## 2020-08-14 DIAGNOSIS — Z20822 Contact with and (suspected) exposure to covid-19: Secondary | ICD-10-CM | POA: Diagnosis not present

## 2020-08-14 DIAGNOSIS — Z03818 Encounter for observation for suspected exposure to other biological agents ruled out: Secondary | ICD-10-CM | POA: Diagnosis not present

## 2020-09-16 ENCOUNTER — Other Ambulatory Visit: Payer: Self-pay | Admitting: Family Medicine

## 2020-09-16 DIAGNOSIS — K219 Gastro-esophageal reflux disease without esophagitis: Secondary | ICD-10-CM

## 2020-09-29 ENCOUNTER — Other Ambulatory Visit: Payer: Self-pay | Admitting: Family Medicine

## 2020-09-29 DIAGNOSIS — F419 Anxiety disorder, unspecified: Secondary | ICD-10-CM

## 2020-09-29 NOTE — Telephone Encounter (Signed)
Requested medication (s) are due for refill today: yes  Requested medication (s) are on the active medication list: yes  Last refill:  07/29/20  Future visit scheduled: yes  Notes to clinic:  med not delegated to NT to RF   Requested Prescriptions  Pending Prescriptions Disp Refills   clonazePAM (KLONOPIN) 1 MG tablet [Pharmacy Med Name: CLONAZEPAM 1MG  TABLETS] 60 tablet     Sig: TAKE 1 TABLET(1 MG) BY MOUTH TWICE DAILY AS NEEDED FOR ANXIETY     Not Delegated - Psychiatry:  Anxiolytics/Hypnotics Failed - 09/29/2020  4:35 PM      Failed - This refill cannot be delegated      Failed - Urine Drug Screen completed in last 360 days      Passed - Valid encounter within last 6 months    Recent Outpatient Visits           4 months ago Stenosis of left carotid artery   [facility] MD   1 year ago Annual physical exam   [facility] MD   2 years ago Essential (primary) hypertension   [facility] MD   3 years ago Annual physical exam   [facility] MD   3 years ago Bronchitis   [facility], Trey Sailors       Future Appointments             In 1 week [place]., MD [facility], [facility]

## 2020-09-30 NOTE — Telephone Encounter (Signed)
Please review. Thanks!  

## 2020-10-01 NOTE — Telephone Encounter (Signed)
Pt states she is out of medication and needs asap. clonazePAM (KLONOPIN) 1 MG tablet

## 2020-10-02 NOTE — Telephone Encounter (Signed)
Pt is calling checking on status clonazepam the needs to go to [facility]. Please call pt back at work number. Pt would like to pick up the medication at 11 am on her lunch break

## 2020-10-09 ENCOUNTER — Ambulatory Visit (INDEPENDENT_AMBULATORY_CARE_PROVIDER_SITE_OTHER): Payer: Medicare Other | Admitting: Family Medicine

## 2020-10-09 ENCOUNTER — Encounter: Payer: Self-pay | Admitting: Family Medicine

## 2020-10-09 ENCOUNTER — Other Ambulatory Visit: Payer: Self-pay

## 2020-10-09 VITALS — BP 133/66 | HR 77 | Temp 98.0°F | Resp 16 | Ht 66.0 in | Wt 141.0 lb

## 2020-10-09 DIAGNOSIS — I10 Essential (primary) hypertension: Secondary | ICD-10-CM | POA: Diagnosis not present

## 2020-10-09 DIAGNOSIS — E785 Hyperlipidemia, unspecified: Secondary | ICD-10-CM

## 2020-10-09 DIAGNOSIS — I6522 Occlusion and stenosis of left carotid artery: Secondary | ICD-10-CM

## 2020-10-09 DIAGNOSIS — H9042 Sensorineural hearing loss, unilateral, left ear, with unrestricted hearing on the contralateral side: Secondary | ICD-10-CM

## 2020-10-09 DIAGNOSIS — F411 Generalized anxiety disorder: Secondary | ICD-10-CM

## 2020-10-09 DIAGNOSIS — Z0000 Encounter for general adult medical examination without abnormal findings: Secondary | ICD-10-CM

## 2020-10-09 DIAGNOSIS — Z789 Other specified health status: Secondary | ICD-10-CM

## 2020-10-09 DIAGNOSIS — K219 Gastro-esophageal reflux disease without esophagitis: Secondary | ICD-10-CM

## 2020-10-09 DIAGNOSIS — Z7289 Other problems related to lifestyle: Secondary | ICD-10-CM

## 2020-10-09 NOTE — Progress Notes (Signed)
Annual Wellness Visit     Patient: Melanie Mitchell, Female    DOB: May 30, 1955, 65 y.o.   MRN: [id number] Visit Date: 10/09/2020  Today's Provider: Megan Mans, MD   Chief Complaint  Patient presents with   Welcome to Medicare    Subjective    Melanie Mitchell is a 65 y.o. female who presents today for her Welcome to Medicare exam. She reports consuming a general diet. The patient does not participate in regular exercise at present. She generally feels well. She reports sleeping well. She does not have additional problems to discuss today.  He smokes about 5 cigarettes/day.  She is drinking about 3 beers per day in recent days.  She says not every day.  She continues to take clonazepam for chronic anxiety and we discussed cutting back on this.  She has chronic swelling of the left ankle from old fracture.  She does have chronic left ear hearing loss.  Medications: Outpatient Medications Prior to Visit  Medication Sig   aspirin EC 81 MG EC tablet Take 1 tablet (81 mg total) by mouth daily.   Cholecalciferol (VITAMIN D) 2000 UNITS tablet Take by mouth.   clonazePAM (KLONOPIN) 1 MG tablet TAKE 1 TABLET(1 MG) BY MOUTH TWICE DAILY AS NEEDED FOR ANXIETY   losartan-hydrochlorothiazide (HYZAAR) 50-12.5 MG tablet TAKE 1 TABLET BY MOUTH EVERY DAY   omeprazole (PRILOSEC) 20 MG capsule TAKE 1 CAPSULE BY MOUTH EVERY DAY   sertraline (ZOLOFT) 100 MG tablet TAKE 1 TABLET BY MOUTH EVERY DAY   simvastatin (ZOCOR) 40 MG tablet TAKE 1 TABLET BY MOUTH EVERY DAY   No facility-administered medications prior to visit.    Allergies  Allergen Reactions   Atorvastatin Diarrhea   Oxycodone Nausea And Vomiting    Mental changes   Penicillins Other (See Comments)    Patient Care Team: Maple Hudson., MD as PCP - General (Family Medicine)  Review of Systems  Constitutional:  Positive for fatigue.  HENT: Negative.    Eyes: Negative.   Respiratory: Negative.    Cardiovascular:  Negative.   Gastrointestinal: Negative.   Endocrine: Negative.   Genitourinary: Negative.   Musculoskeletal:  Positive for arthralgias and joint swelling.  Allergic/Immunologic: Negative.   Neurological: Negative.   Hematological: Negative.   Psychiatric/Behavioral: Negative.          Objective    Vitals: BP 133/66   Pulse 77   Temp 98 F (36.7 C)   Resp 16   Ht 5\' 6"  (1.676 m)   Wt 141 lb (64 kg)   BMI 22.76 kg/m  BP Readings from Last 3 Encounters:  10/09/20 133/66  05/16/20 (!) 144/76  09/27/19 104/60   Wt Readings from Last 3 Encounters:  10/09/20 141 lb (64 kg)  05/16/20 143 lb (64.9 kg)  09/27/19 135 lb (61.2 kg)      Physical Exam Vitals reviewed.  Constitutional:      Appearance: Normal appearance. She is normal weight.  HENT:     Head: Normocephalic and atraumatic.     Right Ear: Tympanic membrane, ear canal and external ear normal.     Left Ear: Tympanic membrane, ear canal and external ear normal.     Nose: Nose normal.     Mouth/Throat:     Mouth: Mucous membranes are moist.     Pharynx: Oropharynx is clear.  Eyes:     Extraocular Movements: Extraocular movements intact.     Conjunctiva/sclera: Conjunctivae normal.  Pupils: Pupils are equal, round, and reactive to light.  Cardiovascular:     Rate and Rhythm: Normal rate and regular rhythm.     Pulses: Normal pulses.     Heart sounds: Normal heart sounds.  Pulmonary:     Effort: Pulmonary effort is normal.     Breath sounds: Normal breath sounds.  Chest:  Breasts:    Right: Normal.     Left: Normal.  Abdominal:     General: Abdomen is flat. Bowel sounds are normal.     Palpations: Abdomen is soft.  Musculoskeletal:     Cervical back: Normal range of motion and neck supple.     Right lower leg: No edema.     Left lower leg: No edema.  Skin:    General: Skin is warm and dry.  Neurological:     General: No focal deficit present.     Mental Status: She is alert and oriented to person,  place, and time. Mental status is at baseline.  Psychiatric:        Mood and Affect: Mood normal.        Behavior: Behavior normal.        Thought Content: Thought content normal.        Judgment: Judgment normal.     Most recent functional status assessment: In your present state of health, do you have any difficulty performing the following activities: 10/09/2020  Hearing? Y  Vision? Y  Difficulty concentrating or making decisions? N  Walking or climbing stairs? Y  Dressing or bathing? N  Doing errands, shopping? N  Some recent data might be hidden   Most recent fall risk assessment: Fall Risk  10/09/2020  Falls in the past year? 1  Number falls in past yr: 1  Injury with Fall? 1  Risk for fall due to : Impaired balance/gait  Follow up Falls evaluation completed    Most recent depression screenings: PHQ 2/9 Scores 10/09/2020 05/16/2020  PHQ - 2 Score 2 2  PHQ- 9 Score 6 6   Most recent cognitive screening: 6CIT Screen 10/09/2020  What Year? 0 points  What month? 0 points  What time? 0 points  Count back from 20 0 points  Months in reverse 0 points  Repeat phrase 0 points  Total Score 0   Most recent Audit-C alcohol use screening Alcohol Use Disorder Test (AUDIT) 10/09/2020  1. How often do you have a drink containing alcohol? 3  2. How many drinks containing alcohol do you have on a typical day when you are drinking? 0  3. How often do you have six or more drinks on one occasion? 0  AUDIT-C Score 3  4. How often during the last year have you found that you were not able to stop drinking once you had started? 0  5. How often during the last year have you failed to do what was normally expected from you because of drinking? 0  6. How often during the last year have you needed a first drink in the morning to get yourself going after a heavy drinking session? 0  7. How often during the last year have you had a feeling of guilt of remorse after drinking? 0  8. How often during  the last year have you been unable to remember what happened the night before because you had been drinking? 0  9. Have you or someone else been injured as a result of your drinking? 0  10. Has a  relative or friend or a doctor or another health worker been concerned about your drinking or suggested you cut down? 0  Alcohol Use Disorder Identification Test Final Score (AUDIT) 3  Alcohol Brief Interventions/Follow-up -   A score of 3 or more in women, and 4 or more in men indicates increased risk for alcohol abuse, EXCEPT if all of the points are from question 1   No results found for any visits on 10/09/20.  Assessment & Plan     Annual wellness visit done today including the all of the following: Reviewed patient's Family Medical History Reviewed and updated list of patient's medical providers Assessment of cognitive impairment was done Assessed patient's functional ability Established a written schedule for health screening services Health Risk Assessent Completed and Reviewed  Exercise Activities and Dietary recommendations  Goals   None     Immunization History  Administered Date(s) Administered   Influenza Whole 11/12/2015   Influenza,inj,Quad PF,6+ Mos 11/21/2014, 10/08/2016   PFIZER(Purple Top)SARS-COV-2 Vaccination 04/18/2019, 05/16/2019   Pneumococcal Polysaccharide-23 08/15/2012   Tdap 08/15/2012    Health Maintenance  Topic Date Due   HIV Screening  Never done   Hepatitis C Screening  Never done   COLONOSCOPY (Pts 45-71yrs Insurance coverage will need to be confirmed)  Never done   Zoster Vaccines- Shingrix (1 of 2) Never done   PAP SMEAR-Modifier  02/20/2018   COVID-19 Vaccine (3 - Booster for Pfizer series) 10/16/2019   PNA vac Low Risk Adult (1 of 2 - PCV13) 11/23/2019   INFLUENZA VACCINE  09/02/2020   MAMMOGRAM  05/09/2021   TETANUS/TDAP  08/16/2022   DEXA SCAN  Completed   HPV VACCINES  Aged Out     Discussed health benefits of physical activity, and  encouraged her to engage in regular exercise appropriate for her age and condition.    1. Welcome to Medicare preventive visit  - EKG 12-Lead  2. Essential (primary) hypertension   3. Stenosis of left carotid artery   4. Gastroesophageal reflux disease without esophagitis   5. Hyperlipidemia, unspecified hyperlipidemia type   6. Generalized anxiety disorder Increase sertraline to 1 full tablet daily Cut back on clonazepam. 7. Alcohol drinker Advised to totally quit   No follow-ups on file.     I, Megan Mans, MD, have reviewed all documentation for this visit. The documentation on 10/12/20 for the exam, diagnosis, procedures, and orders are all accurate and complete.    Haely Leyland Wendelyn Breslow, MD  [facility] [phone] (phone) [phone] (fax)  [facility]

## 2020-11-19 ENCOUNTER — Telehealth: Payer: Self-pay

## 2020-11-19 NOTE — Telephone Encounter (Signed)
Copied from CRM [id number]. Topic: General - Other >> Nov 05, 2020  9:04 AM Gwenlyn Fudge wrote: Reason for CRM: Pt called and is requesting to have a nurse give her a call back to go over how many paps she has had since 2018. She states that she is needing to file these with insurance this morning. Please advise. >> Nov 05, 2020 12:46 PM Leafy Ro wrote: Pt is calling checking on the requesting for pap smears she had

## 2020-11-30 ENCOUNTER — Other Ambulatory Visit: Payer: Self-pay | Admitting: Family Medicine

## 2020-11-30 DIAGNOSIS — I10 Essential (primary) hypertension: Secondary | ICD-10-CM

## 2020-11-30 NOTE — Telephone Encounter (Signed)
Requested medication (s) are due for refill today: yes  Requested medication (s) are on the active medication list: yes  Last refill:  06/04/20 #90 1 RF  Future visit scheduled: yes  Notes to clinic:  overdue lab work   Requested Prescriptions  Pending Prescriptions Disp Refills   losartan-hydrochlorothiazide (HYZAAR) 50-12.5 MG tablet [Pharmacy Med Name: LOSARTAN/HCTZ 50/12.5MG  TABLETS] 90 tablet 1    Sig: TAKE 1 TABLET BY MOUTH EVERY DAY     Cardiovascular: ARB + Diuretic Combos Failed - 11/30/2020  9:21 AM      Failed - K in normal range and within 180 days    Potassium  Date Value Ref Range Status  09/27/2019 3.9 3.5 - 5.2 mmol/L Final          Failed - Na in normal range and within 180 days    Sodium  Date Value Ref Range Status  09/27/2019 132 (L) 134 - 144 mmol/L Final          Failed - Cr in normal range and within 180 days    Creatinine, Ser  Date Value Ref Range Status  09/27/2019 0.74 0.57 - 1.00 mg/dL Final          Failed - Ca in normal range and within 180 days    Calcium  Date Value Ref Range Status  09/27/2019 9.7 8.7 - 10.3 mg/dL Final          Passed - Patient is not pregnant      Passed - Last BP in normal range    BP Readings from Last 1 Encounters:  10/09/20 133/66          Passed - Valid encounter within last 6 months    Recent Outpatient Visits           1 month ago Welcome to [employer] preventive visit   [facility] Maple Hudson., MD   6 months ago Stenosis of left carotid artery   [facility] Maple Hudson., MD   1 year ago Annual physical exam   [facility] Maple Hudson., MD   2 years ago Essential (primary) hypertension   [facility] Maple Hudson., MD   3 years ago Annual physical exam   [facility] Maple Hudson., MD       Future Appointments             In 1 month Maple Hudson., MD [facility], [facility]

## 2020-12-01 ENCOUNTER — Other Ambulatory Visit: Payer: Self-pay | Admitting: Family Medicine

## 2020-12-01 DIAGNOSIS — F419 Anxiety disorder, unspecified: Secondary | ICD-10-CM

## 2020-12-01 NOTE — Telephone Encounter (Signed)
Requested medication (s) are due for refill today: yes  Requested medication (s) are on the active medication list: yes  Last refill:  10/02/20 #60  Future visit scheduled: yes  Notes to clinic:  med not delegated to NT to RF   Requested Prescriptions  Pending Prescriptions Disp Refills   clonazePAM (KLONOPIN) 1 MG tablet [Pharmacy Med Name: CLONAZEPAM 1MG  TABLETS] 60 tablet     Sig: TAKE 1 TABLET(1 MG) BY MOUTH TWICE DAILY AS NEEDED FOR ANXIETY     Not Delegated - Psychiatry:  Anxiolytics/Hypnotics Failed - 12/01/2020  3:45 PM      Failed - This refill cannot be delegated      Failed - Urine Drug Screen completed in last 360 days      Passed - Valid encounter within last 6 months    Recent Outpatient Visits           1 month ago Welcome to 12/03/2020 preventive visit   [facility] [place]   6 months ago Stenosis of left carotid artery   [facility] MD   1 year ago Annual physical exam   [facility] MD   2 years ago Essential (primary) hypertension   [facility] MD   3 years ago Annual physical exam   [facility] MD       Future Appointments             In 1 month Maple Hudson., MD [facility], [facility]

## 2020-12-16 ENCOUNTER — Other Ambulatory Visit: Payer: Self-pay | Admitting: Family Medicine

## 2020-12-16 NOTE — Telephone Encounter (Signed)
Appointment 01/08/21 Requested Prescriptions  Pending Prescriptions Disp Refills  . simvastatin (ZOCOR) 40 MG tablet [Pharmacy Med Name: SIMVASTATIN 40MG  TABLETS] 90 tablet 0    Sig: TAKE 1 TABLET BY MOUTH EVERY DAY     Cardiovascular:  Antilipid - Statins Failed - 12/16/2020  7:52 AM      Failed - Total Cholesterol in normal range and within 360 days    Cholesterol, Total  Date Value Ref Range Status  09/27/2019 160 100 - 199 mg/dL Final         Failed - LDL in normal range and within 360 days    LDL Chol Calc (NIH)  Date Value Ref Range Status  09/27/2019 76 0 - 99 mg/dL Final         Failed - HDL in normal range and within 360 days    HDL  Date Value Ref Range Status  09/27/2019 68 >39 mg/dL Final         Failed - Triglycerides in normal range and within 360 days    Triglycerides  Date Value Ref Range Status  09/27/2019 86 0 - 149 mg/dL Final         Passed - Patient is not pregnant      Passed - Valid encounter within last 12 months    Recent Outpatient Visits          2 months ago Welcome to 09/29/2019 preventive visit   [facility]., MD   7 months ago Stenosis of left carotid artery   [facility]., MD   1 year ago Annual physical exam   [facility] MD   2 years ago Essential (primary) hypertension   [facility]., MD   3 years ago Annual physical exam   [facility] MD      Future Appointments            In 3 weeks Maple Hudson., MD [facility], [facility]

## 2021-01-08 ENCOUNTER — Other Ambulatory Visit: Payer: Self-pay

## 2021-01-08 ENCOUNTER — Ambulatory Visit (INDEPENDENT_AMBULATORY_CARE_PROVIDER_SITE_OTHER): Payer: Medicare Other | Admitting: Family Medicine

## 2021-01-08 ENCOUNTER — Ambulatory Visit: Payer: Medicare Other | Admitting: Family Medicine

## 2021-01-08 ENCOUNTER — Encounter: Payer: Self-pay | Admitting: Family Medicine

## 2021-01-08 VITALS — BP 123/76 | HR 81 | Temp 98.3°F | Resp 16 | Ht 66.0 in | Wt 144.0 lb

## 2021-01-08 DIAGNOSIS — I10 Essential (primary) hypertension: Secondary | ICD-10-CM

## 2021-01-08 DIAGNOSIS — I6522 Occlusion and stenosis of left carotid artery: Secondary | ICD-10-CM | POA: Diagnosis not present

## 2021-01-08 DIAGNOSIS — Z23 Encounter for immunization: Secondary | ICD-10-CM

## 2021-01-08 DIAGNOSIS — F419 Anxiety disorder, unspecified: Secondary | ICD-10-CM | POA: Diagnosis not present

## 2021-01-08 DIAGNOSIS — E785 Hyperlipidemia, unspecified: Secondary | ICD-10-CM

## 2021-01-08 MED ORDER — BUSPIRONE HCL 7.5 MG PO TABS: 7.5000 mg | ORAL_TABLET | Freq: Two times a day (BID) | ORAL | 1 refills | Status: DC | PRN

## 2021-01-08 NOTE — Assessment & Plan Note (Signed)
Doing well on current regimen, no changes made today. Obtaining labs. 

## 2021-01-08 NOTE — Assessment & Plan Note (Signed)
Recheck labs 

## 2021-01-08 NOTE — Patient Instructions (Signed)
It was great to see you!  Our plans for today:  - Try the buspirone instead of the klonopin when you are feeling nervous or on edge. If you don't see much effect after about an hour, you can take your klonopin.  - Come back in 1-2 months for follow up.   We are checking some labs today, we will release these results to your MyChart.  Take care and seek immediate care sooner if you develop any concerns.   Dr. Linwood Dibbles

## 2021-01-08 NOTE — Assessment & Plan Note (Signed)
Still with daily symptoms, suspect daily panic attacks are more from BZD dependence than true attacks, working on weaning and currently at 1 pill daily, 0.5 pill on weekends. Trial buspar for prn, if no improvement in symptoms, ok to follow with klonopin with ultimate goal of weaning off. Continue SSRI at current dose since did not tolerate dose increase. F/u in 1-2 months.

## 2021-01-08 NOTE — Progress Notes (Signed)
   SUBJECTIVE:   CHIEF COMPLAINT / HPI:   Hypertension, CAD: - Medications: losartan-HCTZ, ASA 81, simvastatin - Compliance: good - Checking BP at home: no - Denies any LE edema, medication SEs, or symptoms of hypotension  HLD - medications: simvastatin - compliance: good - medication SEs: none  Anxiety - Medications: sertraline, klonopin (increased sertraline at last visit) - Taking: 50mg  sertraline (whole pill made sleepy, feeling off). Taking 1 pill klonopin daily.  - Counseling: no - Symptoms: panic attacks  - Current stressors: selling parents' house, death of mother, finances - Coping Mechanisms: klonopin, watch comedy  GAD 7 : Generalized Anxiety Score 01/08/2021 03/31/2017 05/13/2016  Nervous, Anxious, on Edge 3 1 1   Control/stop worrying 0 3 1  Worry too much - different things 1 3 1   Trouble relaxing 0 1 1  Restless 0 2 3  Easily annoyed or irritable 1 2 1   Afraid - awful might happen 1 2 1   Total GAD 7 Score 6 14 9   Anxiety Difficulty - Somewhat difficult Very difficult     OBJECTIVE:   BP 123/76 (BP Location: Left Arm, Patient Position: Sitting, Cuff Size: Normal)   Pulse 81   Temp 98.3 F (36.8 C) (Temporal)   Resp 16   Ht 5\' 6"  (1.676 m)   Wt 144 lb (65.3 kg)   SpO2 99%   BMI 23.24 kg/m   Gen: well appearing, in NAD Card: Reg rate Lungs: Comfortable WOB on RA Ext: WWP   ASSESSMENT/PLAN:   BP (high blood pressure) Doing well on current regimen, no changes made today. Obtaining labs.  Anxiety Still with daily symptoms, suspect daily panic attacks are more from BZD dependence than true attacks, working on weaning and currently at 1 pill daily, 0.5 pill on weekends. Trial buspar for prn, if no improvement in symptoms, ok to follow with klonopin with ultimate goal of weaning off. Continue SSRI at current dose since did not tolerate dose increase. F/u in 1-2 months.  HLD (hyperlipidemia) Recheck labs.     07/13/2016, DO

## 2021-02-12 ENCOUNTER — Other Ambulatory Visit: Payer: Self-pay | Admitting: Family Medicine

## 2021-02-12 NOTE — Telephone Encounter (Signed)
Requested medications are due for refill today.  yes  Requested medications are on the active medications list.  yes  Last refill. 12/16/2020  Future visit scheduled.   yes  Notes to clinic.  Failed protocol d/t expired labs.    Requested Prescriptions  Pending Prescriptions Disp Refills   simvastatin (ZOCOR) 40 MG tablet [Pharmacy Med Name: SIMVASTATIN 40MG  TABLETS] 90 tablet 0    Sig: TAKE 1 TABLET BY MOUTH EVERY DAY     Cardiovascular:  Antilipid - Statins Failed - 02/12/2021  3:38 AM      Failed - Total Cholesterol in normal range and within 360 days    Cholesterol, Total  Date Value Ref Range Status  09/27/2019 160 100 - 199 mg/dL Final          Failed - LDL in normal range and within 360 days    LDL Chol Calc (NIH)  Date Value Ref Range Status  09/27/2019 76 0 - 99 mg/dL Final          Failed - HDL in normal range and within 360 days    HDL  Date Value Ref Range Status  09/27/2019 68 >39 mg/dL Final          Failed - Triglycerides in normal range and within 360 days    Triglycerides  Date Value Ref Range Status  09/27/2019 86 0 - 149 mg/dL Final          Passed - Patient is not pregnant      Passed - Valid encounter within last 12 months    Recent Outpatient Visits           1 month ago Primary hypertension   [facility], DO   4 months ago Welcome to [place] preventive visit   [facility] MD   9 months ago Stenosis of left carotid artery   [facility] MD   1 year ago Annual physical exam   [facility] MD   3 years ago Essential (primary) hypertension   [facility] MD       Future Appointments             In 2 months Maple Hudson., MD [facility], [facility]

## 2021-03-13 ENCOUNTER — Other Ambulatory Visit: Payer: Self-pay | Admitting: Family Medicine

## 2021-03-13 DIAGNOSIS — K219 Gastro-esophageal reflux disease without esophagitis: Secondary | ICD-10-CM

## 2021-03-13 NOTE — Telephone Encounter (Signed)
Requested Prescriptions  Pending Prescriptions Disp Refills   omeprazole (PRILOSEC) 20 MG capsule [Pharmacy Med Name: OMEPRAZOLE 20MG  CAPSULES] 90 capsule 1    Sig: TAKE 1 CAPSULE BY MOUTH EVERY DAY     Gastroenterology: Proton Pump Inhibitors Passed - 03/13/2021  3:41 AM      Passed - Valid encounter within last 12 months    Recent Outpatient Visits          2 months ago Primary hypertension   [facility], DO   5 months ago Welcome to [place] preventive visit   [facility]., MD   10 months ago Stenosis of left carotid artery   [facility]., MD   1 year ago Annual physical exam   [facility] MD   3 years ago Essential (primary) hypertension   [facility] MD      Future Appointments            In 2 months Maple Hudson., MD [facility], [facility]

## 2021-04-10 ENCOUNTER — Telehealth: Payer: Self-pay

## 2021-04-10 DIAGNOSIS — I10 Essential (primary) hypertension: Secondary | ICD-10-CM | POA: Diagnosis not present

## 2021-04-10 NOTE — Telephone Encounter (Signed)
Patient said she couldn't take the Buspirone. It made her feel "out of this world".   She is needing a refill of Klonipen 1 mg. sent to [facility] on [address]

## 2021-04-10 NOTE — Telephone Encounter (Signed)
Please advise 

## 2021-04-11 LAB — COMPREHENSIVE METABOLIC PANEL
ALT: 13 IU/L (ref 0–32)
AST: 28 IU/L (ref 0–40)
Albumin/Globulin Ratio: 1.8 (ref 1.2–2.2)
Albumin: 4.6 g/dL (ref 3.8–4.8)
Alkaline Phosphatase: 118 IU/L (ref 44–121)
BUN/Creatinine Ratio: 12 (ref 12–28)
BUN: 11 mg/dL (ref 8–27)
Bilirubin Total: 0.3 mg/dL (ref 0.0–1.2)
CO2: 24 mmol/L (ref 20–29)
Calcium: 9.5 mg/dL (ref 8.7–10.3)
Chloride: 99 mmol/L (ref 96–106)
Creatinine, Ser: 0.9 mg/dL (ref 0.57–1.00)
Globulin, Total: 2.6 g/dL (ref 1.5–4.5)
Glucose: 112 mg/dL — ABNORMAL HIGH (ref 70–99)
Potassium: 4 mmol/L (ref 3.5–5.2)
Sodium: 139 mmol/L (ref 134–144)
Total Protein: 7.2 g/dL (ref 6.0–8.5)
eGFR: 71 mL/min/{1.73_m2} (ref >59)

## 2021-04-11 LAB — LIPID PANEL
Chol/HDL Ratio: 4.2 ratio (ref 0.0–4.4)
Cholesterol, Total: 214 mg/dL — ABNORMAL HIGH (ref 100–199)
HDL: 51 mg/dL (ref >39)
LDL Chol Calc (NIH): 127 mg/dL — ABNORMAL HIGH (ref 0–99)
Triglycerides: 206 mg/dL — ABNORMAL HIGH (ref 0–149)
VLDL Cholesterol Cal: 36 mg/dL (ref 5–40)

## 2021-04-14 ENCOUNTER — Other Ambulatory Visit: Payer: Self-pay | Admitting: Family Medicine

## 2021-04-14 DIAGNOSIS — F419 Anxiety disorder, unspecified: Secondary | ICD-10-CM

## 2021-04-15 ENCOUNTER — Telehealth: Payer: Self-pay

## 2021-04-15 NOTE — Telephone Encounter (Signed)
Copied from CRM [id number]. Topic: General - Other ?>> Apr 14, 2021  3:33 PM Pawlus, Maxine Glenn A wrote: ?Reason for CRM: Pt wanted a call to go over her latest lab results, please advise. ?>> Apr 15, 2021  1:43 PM Wyonia Hough E wrote: ?Pt is at work so please call [phone]/ she would like a call to go over her labs / please advise  ?

## 2021-05-06 NOTE — Progress Notes (Deleted)
?  ? ? ?Established patient visit ? ? ?Patient: Melanie Mitchell   DOB: 1955/12/30   66 y.o. Female  MRN: [id number] ?Visit Date: 05/12/2021 ? ?Today's healthcare provider: Wilhemena Durie, MD  ? ?No chief complaint on file. ? ?Subjective  ?  ?HPI  ?Hypertension, follow-up ? ?BP Readings from Last 3 Encounters:  ?01/08/21 123/76  ?10/09/20 133/66  ?05/16/20 (!) 144/76  ? Wt Readings from Last 3 Encounters:  ?01/08/21 144 lb (65.3 kg)  ?10/09/20 141 lb (64 kg)  ?05/16/20 143 lb (64.9 kg)  ?  ? ?She was last seen for hypertension 4 months ago.  ?BP at that visit was 123/76. ?Management since that visit includes; seen by Dr. Ky Barban. ?She reports {excellent/good/fair/poor:19665} compliance with treatment. ?She {is/is not:9024} having side effects. {document side effects if present:1} ?She {is/is not:9024} exercising. ?She {is/is not:9024} adherent to low salt diet.   ?Outside blood pressures are {enter patient reported home BP, or 'not being checked':1}. ? ?She {does/does not:200015} smoke. ? ?Use of agents associated with hypertension: {bp agents assoc with hypertension:511::"none"}.  ? ?--------------------------------------------------------------------------------------------------- ?Lipid/Cholesterol, follow-up ? ?Last Lipid Panel: ?Lab Results  ?Component Value Date  ? CHOL 214 (H) 04/10/2021  ? LDLCALC 127 (H) 04/10/2021  ? HDL 51 04/10/2021  ? TRIG 206 (H) 04/10/2021  ? ? ?She was last seen for this 4 months ago.  ?Management since that visit includes; seen by Dr. Ky Barban. Labs checked-advised diet and exercise ? ?She reports {excellent/good/fair/poor:19665} compliance with treatment. ?She {is/is not:9024} having side effects. {document side effects if present:1} ? ?She is following a {diet:21022986} diet. ?Current exercise: {exercise types:16438} ? ?Last metabolic panel ?Lab Results  ?Component Value Date  ? GLUCOSE 112 (H) 04/10/2021  ? NA 139 04/10/2021  ? K 4.0 04/10/2021  ? BUN 11 04/10/2021  ?  CREATININE 0.90 04/10/2021  ? EGFR 71 04/10/2021  ? GFRNONAA 86 09/27/2019  ? CALCIUM 9.5 04/10/2021  ? AST 28 04/10/2021  ? ALT 13 04/10/2021  ? ?The 10-year ASCVD risk score (Arnett DK, et al., 2019) is: 14.3% ? ?--------------------------------------------------------------------------------------------------- ?Anxiety, Follow-up ? ?She was last seen for anxiety 4 months ago. ?Changes made at last visit include; working on weaning and currently at 1 pill daily, 0.5 pill on weekends. Trial buspar for prn, if no improvement in symptoms, ok to follow with klonopin with ultimate goal of weaning off. Continue SSRI at current dose since did not tolerate dose increase. F/u in 1-2 months. ?  ?She reports {excellent/good/fair/poor:19665} compliance with treatment. ?She reports {good/fair/poor:18685} tolerance of treatment. ?She {is/is not:21021397} having side effects. {document side effects if present:1} ? ?She feels her anxiety is {Desc; severity:60313} and {improved/worse/unchanged:3041574} since last visit. ? ?Symptoms: ?{Yes/No:20286} chest pain {Yes/No:20286} difficulty concentrating  ?{Yes/No:20286} dizziness {Yes/No:20286} fatigue  ?{Yes/No:20286} feelings of losing control {Yes/No:20286} insomnia  ?{Yes/No:20286} irritable {Yes/No:20286} palpitations  ?{Yes/No:20286} panic attacks {Yes/No:20286} racing thoughts  ?{Yes/No:20286} shortness of breath {Yes/No:20286} sweating  ?{Yes/No:20286} tremors/shakes   ? ?GAD-7 Results ? ?  01/08/2021  ? 11:12 AM 03/31/2017  ?  4:03 PM 05/13/2016  ? 10:25 AM  ?GAD-7 Generalized Anxiety Disorder Screening Tool  ?1. Feeling Nervous, Anxious, or on Edge $Remov'3 1 1  'ddfROH$ ?2. Not Being Able to Stop or Control Worrying 0 3 1  ?3. Worrying Too Much About Different Things $RemoveBeforeDE'1 3 1  'gMUrKUfazdcKMau$ ?4. Trouble Relaxing 0 1 1  ?5. Being So Restless it's Hard To Sit Still 0 2 3  ?6. Becoming Easily Annoyed or Irritable $RemoveBefo'1 2 1  'VUGqmYKckJh$ ?  7. Feeling Afraid As If Something Awful Might Happen $RemoveBefo'1 2 1  'UxwbqhYjTUx$ ?Total GAD-7 Score $RemoveBef'6 14 9   'LohKYEvrmC$ ?Difficulty At Work, Home, or Getting  Along With Others?  Somewhat difficult Very difficult  ? ? ?PHQ-9 Scores ? ?  01/08/2021  ? 11:12 AM 10/09/2020  ? 11:28 AM 05/16/2020  ? 10:45 AM  ?PHQ9 SCORE ONLY  ?PHQ-9 Total Score $RemoveBef'6 6 6  'IiYikmxPGE$ ? ? ?--------------------------------------------------------------------------------------------------- ? ? ?Medications: ?Outpatient Medications Prior to Visit  ?Medication Sig  ? aspirin EC 81 MG EC tablet Take 1 tablet (81 mg total) by mouth daily.  ? busPIRone (BUSPAR) 7.5 MG tablet Take 1 tablet (7.5 mg total) by mouth 2 (two) times daily as needed.  ? Cholecalciferol (VITAMIN D) 2000 UNITS tablet Take by mouth.  ? clonazePAM (KLONOPIN) 1 MG tablet TAKE 1 TABLET(1 MG) BY MOUTH TWICE DAILY AS NEEDED FOR ANXIETY  ? losartan-hydrochlorothiazide (HYZAAR) 50-12.5 MG tablet TAKE 1 TABLET BY MOUTH EVERY DAY  ? omeprazole (PRILOSEC) 20 MG capsule TAKE 1 CAPSULE BY MOUTH EVERY DAY  ? sertraline (ZOLOFT) 100 MG tablet TAKE 1 TABLET BY MOUTH EVERY DAY (Patient taking differently: 50 mg. TAKE 1 TABLET BY MOUTH EVERY DAY)  ? simvastatin (ZOCOR) 40 MG tablet TAKE 1 TABLET BY MOUTH EVERY DAY  ? ?No facility-administered medications prior to visit.  ? ? ?Review of Systems  ?Constitutional:  Negative for appetite change, chills, fatigue and fever.  ?Respiratory:  Negative for chest tightness and shortness of breath.   ?Cardiovascular:  Negative for chest pain and palpitations.  ?Gastrointestinal:  Negative for abdominal pain, nausea and vomiting.  ?Neurological:  Negative for dizziness and weakness.  ? ?{Labs  Heme  Chem  Endocrine  Serology  Results Review (optional):23779} ?  Objective  ?  ?There were no vitals taken for this visit. ?{Show previous vital signs (optional):23777} ? ?Physical Exam  ?*** ? ?No results found for any visits on 05/12/21. ? Assessment & Plan  ?  ? ?*** ? ?No follow-ups on file.  ?   ? ?{provider attestation***:1} ? ? ?Richard Cranford Mon, MD  ?[facility] ?[phone] (phone) ?[phone] (fax) ? ?[facility] ?

## 2021-05-12 ENCOUNTER — Other Ambulatory Visit: Payer: Self-pay | Admitting: Family Medicine

## 2021-05-12 ENCOUNTER — Encounter (INDEPENDENT_AMBULATORY_CARE_PROVIDER_SITE_OTHER): Payer: Medicare Other | Admitting: Family Medicine

## 2021-05-12 DIAGNOSIS — I10 Essential (primary) hypertension: Secondary | ICD-10-CM

## 2021-05-12 DIAGNOSIS — E785 Hyperlipidemia, unspecified: Secondary | ICD-10-CM

## 2021-05-12 DIAGNOSIS — F419 Anxiety disorder, unspecified: Secondary | ICD-10-CM

## 2021-05-28 ENCOUNTER — Other Ambulatory Visit: Payer: Self-pay | Admitting: Family Medicine

## 2021-05-28 DIAGNOSIS — I10 Essential (primary) hypertension: Secondary | ICD-10-CM

## 2021-06-03 ENCOUNTER — Ambulatory Visit (INDEPENDENT_AMBULATORY_CARE_PROVIDER_SITE_OTHER): Payer: Medicare Other | Admitting: Family Medicine

## 2021-06-03 VITALS — BP 135/80 | HR 102 | Temp 98.2°F | Wt 138.0 lb

## 2021-06-03 DIAGNOSIS — Z720 Tobacco use: Secondary | ICD-10-CM

## 2021-06-03 DIAGNOSIS — I10 Essential (primary) hypertension: Secondary | ICD-10-CM | POA: Diagnosis not present

## 2021-06-03 DIAGNOSIS — M810 Age-related osteoporosis without current pathological fracture: Secondary | ICD-10-CM | POA: Diagnosis not present

## 2021-06-03 DIAGNOSIS — Z789 Other specified health status: Secondary | ICD-10-CM

## 2021-06-03 DIAGNOSIS — I6522 Occlusion and stenosis of left carotid artery: Secondary | ICD-10-CM | POA: Diagnosis not present

## 2021-06-03 DIAGNOSIS — F330 Major depressive disorder, recurrent, mild: Secondary | ICD-10-CM

## 2021-06-03 DIAGNOSIS — E785 Hyperlipidemia, unspecified: Secondary | ICD-10-CM

## 2021-06-03 DIAGNOSIS — F411 Generalized anxiety disorder: Secondary | ICD-10-CM

## 2021-06-03 DIAGNOSIS — K219 Gastro-esophageal reflux disease without esophagitis: Secondary | ICD-10-CM | POA: Diagnosis not present

## 2021-06-03 MED ORDER — ROSUVASTATIN CALCIUM 40 MG PO TABS: 40.0000 mg | ORAL_TABLET | Freq: Every day | ORAL | 3 refills | Status: DC

## 2021-06-03 NOTE — Patient Instructions (Addendum)
Take Calcium at least 1200 mg and Vitamin D at least 400 mg daily ? ?Stop smoking.  Use Nicorette gum to help stop smoking ? ?Someone will call with the appointment for your bode density ?

## 2021-06-03 NOTE — Progress Notes (Signed)
? ?I,Elena D DeSanto,acting as a scribe for Wilhemena Durie, MD.,have documented all relevant documentation on the behalf of Wilhemena Durie, MD,as directed by  Wilhemena Durie, MD while in the presence of Wilhemena Durie, MD. ?  ? ? ?Established patient visit ? ? ?Patient: Melanie Mitchell   DOB: 01-19-1956   66 y.o. Female  MRN: [id number] ?Visit Date: 06/03/2021 ? ?Today's healthcare provider: Wilhemena Durie, MD  ? ?No chief complaint on file. ? ?Subjective  ?  ?HPI  ?Patient comes in today for follow-up.  She continues to smoke 1/4 pack/day of cigarettes.  She has for 50 years.  Patient is willing to try to stop smoking.  She has no real real desire to do so. ?Hypertension, follow-up ? ?BP Readings from Last 3 Encounters:  ?01/08/21 123/76  ?10/09/20 133/66  ?05/16/20 (!) 144/76  ? Wt Readings from Last 3 Encounters:  ?01/08/21 144 lb (65.3 kg)  ?10/09/20 141 lb (64 kg)  ?05/16/20 143 lb (64.9 kg)  ?  ? ?She was last seen for hypertension 5 months ago.  ?BP at that visit was as above. Management since that visit includes none. ? ? ?Symptoms: ?No chest pain No chest pressure  ?No palpitations No syncope  ?No dyspnea No orthopnea  ?No paroxysmal nocturnal dyspnea No lower extremity edema  ? ?Pertinent labs ?Lab Results  ?Component Value Date  ? CHOL 214 (H) 04/10/2021  ? HDL 51 04/10/2021  ? LDLCALC 127 (H) 04/10/2021  ? TRIG 206 (H) 04/10/2021  ? CHOLHDL 4.2 04/10/2021  ? Lab Results  ?Component Value Date  ? NA 139 04/10/2021  ? K 4.0 04/10/2021  ? CREATININE 0.90 04/10/2021  ? EGFR 71 04/10/2021  ? GLUCOSE 112 (H) 04/10/2021  ? TSH 3.590 09/27/2019  ?  ? ?The 10-year ASCVD risk score (Arnett DK, et al., 2019) is: 14.3% ? ?--------------------------------------------------------------------------------------------------- ? ? ?Anxiety, Follow-up ? ?She was last seen for anxiety 5 months ago. ?Changes made at last visit include trial of Buspar. ?Patient was intolerant of the Buspar.  Patient stated  it made her dizzy and feel 'wierd"  ?Patient is semi retired and feels this is helping her symptoms some.  ? ?Symptoms: ?No chest pain No difficulty concentrating  ?No dizziness No fatigue  ?No feelings of losing control No insomnia  ?No irritable No palpitations  ?No panic attacks No racing thoughts  ?No shortness of breath No sweating  ?No tremors/shakes   ? ?GAD-7 Results ? ?  01/08/2021  ? 11:12 AM 03/31/2017  ?  4:03 PM 05/13/2016  ? 10:25 AM  ?GAD-7 Generalized Anxiety Disorder Screening Tool  ?1. Feeling Nervous, Anxious, or on Edge $Remov'3 1 1  'lISflu$ ?2. Not Being Able to Stop or Control Worrying 0 3 1  ?3. Worrying Too Much About Different Things $RemoveBeforeDE'1 3 1  'jTuutsMXvViKMxr$ ?4. Trouble Relaxing 0 1 1  ?5. Being So Restless it's Hard To Sit Still 0 2 3  ?6. Becoming Easily Annoyed or Irritable $RemoveBefo'1 2 1  'fCaHiEQVzRD$ ?7. Feeling Afraid As If Something Awful Might Happen $RemoveBefo'1 2 1  'NiDrKruKojH$ ?Total GAD-7 Score $RemoveBef'6 14 9  'ndWSihDApP$ ?Difficulty At Work, Home, or Getting  Along With Others?  Somewhat difficult Very difficult  ? ? ?PHQ-9 Scores ? ?  01/08/2021  ? 11:12 AM 10/09/2020  ? 11:28 AM 05/16/2020  ? 10:45 AM  ?PHQ9 SCORE ONLY  ?PHQ-9 Total Score $RemoveBef'6 6 6  'xqLvPZICZD$ ? ? ?--------------------------------------------------------------------------------------------------- ? ? ?Medications: ?Outpatient Medications Prior to Visit  ?  Medication Sig  ? aspirin EC 81 MG EC tablet Take 1 tablet (81 mg total) by mouth daily.  ? busPIRone (BUSPAR) 7.5 MG tablet Take 1 tablet (7.5 mg total) by mouth 2 (two) times daily as needed.  ? Cholecalciferol (VITAMIN D) 2000 UNITS tablet Take by mouth.  ? clonazePAM (KLONOPIN) 1 MG tablet TAKE 1 TABLET(1 MG) BY MOUTH TWICE DAILY AS NEEDED FOR ANXIETY  ? losartan-hydrochlorothiazide (HYZAAR) 50-12.5 MG tablet TAKE 1 TABLET BY MOUTH EVERY DAY  ? omeprazole (PRILOSEC) 20 MG capsule TAKE 1 CAPSULE BY MOUTH EVERY DAY  ? sertraline (ZOLOFT) 100 MG tablet TAKE 1 TABLET BY MOUTH EVERY DAY (Patient taking differently: 50 mg. TAKE 1 TABLET BY MOUTH EVERY DAY)  ? simvastatin (ZOCOR)  40 MG tablet TAKE 1 TABLET BY MOUTH EVERY DAY  ? ?No facility-administered medications prior to visit.  ? ? ?Review of Systems ? ?Last lipids ?Lab Results  ?Component Value Date  ? CHOL 214 (H) 04/10/2021  ? HDL 51 04/10/2021  ? LDLCALC 127 (H) 04/10/2021  ? TRIG 206 (H) 04/10/2021  ? CHOLHDL 4.2 04/10/2021  ? ?  ?  Objective  ?  ?There were no vitals taken for this visit. ?BP Readings from Last 3 Encounters:  ?06/03/21 135/80  ?01/08/21 123/76  ?10/09/20 133/66  ? ?Wt Readings from Last 3 Encounters:  ?06/03/21 138 lb (62.6 kg)  ?01/08/21 144 lb (65.3 kg)  ?10/09/20 141 lb (64 kg)  ? ?  ? ?Physical Exam ?Vitals reviewed.  ?Constitutional:   ?   General: She is not in acute distress. ?   Appearance: She is well-developed.  ?HENT:  ?   Head: Normocephalic and atraumatic.  ?   Right Ear: Hearing normal.  ?   Left Ear: Hearing normal.  ?   Nose: Nose normal.  ?Eyes:  ?   General: Lids are normal. No scleral icterus.    ?   Right eye: No discharge.     ?   Left eye: No discharge.  ?   Conjunctiva/sclera: Conjunctivae normal.  ?Cardiovascular:  ?   Rate and Rhythm: Normal rate and regular rhythm.  ?   Heart sounds: Normal heart sounds.  ?Pulmonary:  ?   Effort: Pulmonary effort is normal. No respiratory distress.  ?Skin: ?   General: Skin is warm and dry.  ?   Findings: No lesion or rash.  ?Neurological:  ?   General: No focal deficit present.  ?   Mental Status: She is alert and oriented to person, place, and time.  ?Psychiatric:     ?   Mood and Affect: Mood normal.     ?   Speech: Speech normal.     ?   Behavior: Behavior normal.     ?   Thought Content: Thought content normal.     ?   Judgment: Judgment normal.  ?  ? ? ?No results found for any visits on 06/03/21. ? Assessment & Plan  ?  ? ?1. Osteoporosis, unspecified osteoporosis type, unspecified pathological fracture presence ?BMD later this year. ?Take calcium and vitamin D daily. ?- DG Bone Density; Future ? ?2. Essential (primary) hypertension ?Good blood  pressure control. ? ?3. Stenosis of left carotid artery ?Aggressively treat all risk factors and patient really needs to quit smoking. ?Followed by vascular ? ?4. Gastroesophageal reflux disease without esophagitis ? ? ?5. Hyperlipidemia, unspecified hyperlipidemia type ?On simvastatin, consider changing to rosuvastatin ? ?6. Mild episode of recurrent major depressive disorder ([place]) ?  On sertraline.  Continue same ? ?7. Alcohol drinker ?Advised to stay away from alcohol completely.  She has not been drinking much at all lately ? ?8. Generalized anxiety disorder ?Chronic issue.  Continue BuSpar.  I recommended she cut down on clonazepam which she has ? ?9. Tobacco abuse ?Patient strongly advised to quit smoking. ?Consider low-dose chest CT if she meets criteria ? ? ?No follow-ups on file.  ?   ? ?I, Wilhemena Durie, MD, have reviewed all documentation for this visit. The documentation on 06/09/21 for the exam, diagnosis, procedures, and orders are all accurate and complete. ? ? ? ?Brentton Wardlow Cranford Mon, MD  ?[facility] ?[phone] (phone) ?[phone] (fax) ? ?[facility] ?

## 2021-06-09 ENCOUNTER — Other Ambulatory Visit: Payer: Self-pay | Admitting: Family Medicine

## 2021-06-09 DIAGNOSIS — K219 Gastro-esophageal reflux disease without esophagitis: Secondary | ICD-10-CM

## 2021-06-10 ENCOUNTER — Other Ambulatory Visit: Payer: Self-pay | Admitting: Family Medicine

## 2021-06-10 DIAGNOSIS — M85852 Other specified disorders of bone density and structure, left thigh: Secondary | ICD-10-CM | POA: Diagnosis not present

## 2021-06-10 DIAGNOSIS — Z1231 Encounter for screening mammogram for malignant neoplasm of breast: Secondary | ICD-10-CM | POA: Diagnosis not present

## 2021-06-10 DIAGNOSIS — M810 Age-related osteoporosis without current pathological fracture: Secondary | ICD-10-CM | POA: Diagnosis not present

## 2021-06-10 DIAGNOSIS — Z780 Asymptomatic menopausal state: Secondary | ICD-10-CM | POA: Diagnosis not present

## 2021-06-10 DIAGNOSIS — F419 Anxiety disorder, unspecified: Secondary | ICD-10-CM

## 2021-06-10 LAB — HM DEXA SCAN

## 2021-06-10 LAB — HM MAMMOGRAPHY

## 2021-06-10 NOTE — Telephone Encounter (Signed)
Requested Prescriptions  ?Pending Prescriptions Disp Refills  ?? omeprazole (PRILOSEC) 20 MG capsule [Pharmacy Med Name: OMEPRAZOLE 20MG  CAPSULES] 90 capsule 0  ?  Sig: TAKE 1 CAPSULE BY MOUTH EVERY DAY  ?  ? Gastroenterology: Proton Pump Inhibitors Passed - 06/09/2021  3:39 AM  ?  ?  Passed - Valid encounter within last 12 months  ?  Recent Outpatient Visits   ?      ? 1 week ago Osteoporosis, unspecified osteoporosis type, unspecified pathological fracture presence  ? [facility] Jerrol Banana., MD  ? 5 months ago Primary hypertension  ? [place], DO  ? 8 months ago Welcome to [employer] preventive visit  ? [facility] Jerrol Banana., MD  ? 1 year ago Stenosis of left carotid artery  ? [facility] Jerrol Banana., MD  ? 1 year ago Annual physical exam  ? [facility] Jerrol Banana., MD  ?  ?  ?Future Appointments   ?        ? In 4 months Jerrol Banana., MD [facility]  ?  ? ?  ?  ?  ? ? ?

## 2021-06-11 NOTE — Telephone Encounter (Signed)
Requested Prescriptions  ?Pending Prescriptions Disp Refills  ?? clonazePAM (KLONOPIN) 1 MG tablet [Pharmacy Med Name: CLONAZEPAM 1MG  TABLETS] 60 tablet   ?  Sig: TAKE 1 TABLET(1 MG) BY MOUTH TWICE DAILY AS NEEDED FOR ANXIETY  ?  ? Not Delegated - Psychiatry: Anxiolytics/Hypnotics 2 Failed - 06/10/2021  6:30 PM  ?  ?  Failed - This refill cannot be delegated  ?  ?  Failed - Urine Drug Screen completed in last 360 days  ?  ?  Passed - Patient is not pregnant  ?  ?  Passed - Valid encounter within last 6 months  ?  Recent Outpatient Visits   ?      ? 1 week ago Osteoporosis, unspecified osteoporosis type, unspecified pathological fracture presence  ? [facility] MD  ? 5 months ago Primary hypertension  ? [facility] [place], [place], DO  ? 8 months ago Welcome to [facility] preventive visit  ? [facility]., MD  ? 1 year ago Stenosis of left carotid artery  ? [facility]., MD  ? 1 year ago Annual physical exam  ? [facility]., MD  ?  ?  ?Future Appointments   ?        ? In 4 months Maple Hudson., MD [facility]  ?  ? ?  ?  ?  ?? sertraline (ZOLOFT) 100 MG tablet [Pharmacy Med Name: SERTRALINE 100MG  TABLETS] 30 tablet 12  ?  Sig: TAKE 1 TABLET BY MOUTH EVERY DAY  ?  ? Psychiatry:  Antidepressants - SSRI - sertraline Passed - 06/10/2021  6:30 PM  ?  ?  Passed - AST in normal range and within 360 days  ?  AST  ?Date Value Ref Range Status  ?04/10/2021 28 0 - 40 IU/L Final  ?   ?  ?  Passed - ALT in normal range and within 360 days  ?  ALT  ?Date Value Ref Range Status  ?04/10/2021 13 0 - 32 IU/L Final  ?   ?  ?  Passed - Completed PHQ-2 or PHQ-9 in the last 360 days  ?  ?  Passed - Valid encounter within last 6 months  ?  Recent Outpatient Visits   ?      ? 1 week ago Osteoporosis, unspecified osteoporosis type, unspecified pathological  fracture presence  ? [facility] 06/10/2021., MD  ? 5 months ago Primary hypertension  ? [facility] [place], [facility] M, DO  ? 8 months ago Welcome to [place] preventive visit  ? [facility] [employer] MD  ? 1 year ago Stenosis of left carotid artery  ? [facility] Maple Hudson., MD  ? 1 year ago Annual physical exam  ? [facility] Maple Hudson., MD  ?  ?  ?Future Appointments   ?        ? In 4 months [facility] MD [facility]  ?  ? ?  ?  ?  ? ? ?

## 2021-06-11 NOTE — Telephone Encounter (Signed)
Requested medication (s) are due for refill today: yes ? ?Requested medication (s) are on the active medication list: yes ? ?Last refill:  04/14/21 #60/0 ? ?Future visit scheduled: yes ? ?Notes to clinic:  Unable to refill per protocol, cannot delegate. ? ? ?  ?Requested Prescriptions  ?Pending Prescriptions Disp Refills  ? clonazePAM (KLONOPIN) 1 MG tablet [Pharmacy Med Name: CLONAZEPAM 1MG  TABLETS] 60 tablet   ?  Sig: TAKE 1 TABLET(1 MG) BY MOUTH TWICE DAILY AS NEEDED FOR ANXIETY  ?  ? Not Delegated - Psychiatry: Anxiolytics/Hypnotics 2 Failed - 06/10/2021  6:30 PM  ?  ?  Failed - This refill cannot be delegated  ?  ?  Failed - Urine Drug Screen completed in last 360 days  ?  ?  Passed - Patient is not pregnant  ?  ?  Passed - Valid encounter within last 6 months  ?  Recent Outpatient Visits   ? ?      ? 1 week ago Osteoporosis, unspecified osteoporosis type, unspecified pathological fracture presence  ? [facility] Jerrol Banana., MD  ? 5 months ago Primary hypertension  ? [place], DO  ? 8 months ago Welcome to [employer] preventive visit  ? [facility] Jerrol Banana., MD  ? 1 year ago Stenosis of left carotid artery  ? [facility] Jerrol Banana., MD  ? 1 year ago Annual physical exam  ? [facility] Jerrol Banana., MD  ? ?  ?  ?Future Appointments   ? ?        ? In 4 months Jerrol Banana., MD [facility]  ? ?  ? ? ?  ?  ?  ?Signed Prescriptions Disp Refills  ? sertraline (ZOLOFT) 100 MG tablet 90 tablet 1  ?  Sig: TAKE 1 TABLET BY MOUTH EVERY DAY  ?  ? Psychiatry:  Antidepressants - SSRI - sertraline Passed - 06/10/2021  6:30 PM  ?  ?  Passed - AST in normal range and within 360 days  ?  AST  ?Date Value Ref Range Status  ?04/10/2021 28 0 - 40 IU/L Final  ?  ?  ?  ?  Passed - ALT in normal range and within 360 days  ?  ALT  ?Date Value Ref Range Status  ?04/10/2021  13 0 - 32 IU/L Final  ?  ?  ?  ?  Passed - Completed PHQ-2 or PHQ-9 in the last 360 days  ?  ?  Passed - Valid encounter within last 6 months  ?  Recent Outpatient Visits   ? ?      ? 1 week ago Osteoporosis, unspecified osteoporosis type, unspecified pathological fracture presence  ? [facility] Jerrol Banana., MD  ? 5 months ago Primary hypertension  ? [place], DO  ? 8 months ago Welcome to [employer] preventive visit  ? [facility] Jerrol Banana., MD  ? 1 year ago Stenosis of left carotid artery  ? [facility] Jerrol Banana., MD  ? 1 year ago Annual physical exam  ? [facility] Jerrol Banana., MD  ? ?  ?  ?Future Appointments   ? ?        ? In 4 months Jerrol Banana., MD [facility]  ? ?  ? ? ?  ?  ?  ? ?

## 2021-06-16 ENCOUNTER — Telehealth: Payer: Self-pay | Admitting: Family Medicine

## 2021-06-16 ENCOUNTER — Other Ambulatory Visit: Payer: Self-pay

## 2021-06-16 ENCOUNTER — Telehealth: Payer: Self-pay

## 2021-06-16 DIAGNOSIS — F419 Anxiety disorder, unspecified: Secondary | ICD-10-CM

## 2021-06-16 NOTE — Telephone Encounter (Signed)
Pt is requesting a call back today regarding her medication refill. Pt stated this request was sent on 06/10/21. ? ?Please advise.  ? ? ? ? ? ?

## 2021-06-16 NOTE — Telephone Encounter (Signed)
[facility] pharmacy faxed refill request for the following medications: ? ?clonazePAM (KLONOPIN) 1 MG tablet ? ? ?Please advise ? ?

## 2021-06-16 NOTE — Telephone Encounter (Signed)
Pt was calling to follow up on clonazePAM 1 MG pt wanted to know when this will be refilled.  ?

## 2021-06-16 NOTE — Telephone Encounter (Signed)
Pt is calling back requesting a call back today.  ?

## 2021-06-16 NOTE — Telephone Encounter (Signed)
Copied from CRM [id number]. Topic: General - Other ?>> Jun 16, 2021 10:32 AM Pawlus, Maxine Glenn A wrote: ?Reason for CRM: Pt called in wanting to go over her latest results - bone density and mammogram results- please advise. ?

## 2021-09-30 ENCOUNTER — Telehealth: Payer: Self-pay | Admitting: Family Medicine

## 2021-09-30 NOTE — Telephone Encounter (Signed)
Copied from CRM [id number]. Topic: Medicare AWV >> Sep 30, 2021 11:59 AM Zannie Kehr wrote: Reason for CRM:  Left message for patient to call back and schedule Medicare Annual Wellness Visit (AWV) in office.   If not able to come in the office, please offer to do virtually or by telephone.   No hx of AWV - AWV-I eligible per [employer] as of  10/10/2021  Please schedule at anytime with [facility] Health Advisor.   45 minute appointment  Any questions, please contact me at [phone]

## 2021-10-14 ENCOUNTER — Ambulatory Visit (INDEPENDENT_AMBULATORY_CARE_PROVIDER_SITE_OTHER): Payer: Medicare Other | Admitting: Family Medicine

## 2021-10-14 ENCOUNTER — Encounter: Payer: Self-pay | Admitting: Family Medicine

## 2021-10-14 VITALS — BP 94/56 | HR 88 | Resp 16 | Wt 139.0 lb

## 2021-10-14 DIAGNOSIS — F419 Anxiety disorder, unspecified: Secondary | ICD-10-CM

## 2021-10-14 DIAGNOSIS — Z23 Encounter for immunization: Secondary | ICD-10-CM | POA: Diagnosis not present

## 2021-10-14 DIAGNOSIS — I6522 Occlusion and stenosis of left carotid artery: Secondary | ICD-10-CM

## 2021-10-14 DIAGNOSIS — F330 Major depressive disorder, recurrent, mild: Secondary | ICD-10-CM

## 2021-10-14 DIAGNOSIS — I10 Essential (primary) hypertension: Secondary | ICD-10-CM

## 2021-10-14 DIAGNOSIS — Z789 Other specified health status: Secondary | ICD-10-CM

## 2021-10-14 MED ORDER — CLONAZEPAM 1 MG PO TABS: ORAL_TABLET | ORAL | 1 refills | Status: DC

## 2021-10-14 NOTE — Progress Notes (Signed)
Established patient visit  I,April Miller,acting as a scribe for Wilhemena Durie, MD.,have documented all relevant documentation on the behalf of Wilhemena Durie, MD,as directed by  Wilhemena Durie, MD while in the presence of Wilhemena Durie, MD.   Patient: Melanie Mitchell   DOB: September 11, 1955   66 y.o. Female  MRN: [id number] Visit Date: 10/14/2021  Today's healthcare provider: Wilhemena Durie, MD   Chief Complaint  Patient presents with   Follow-up   Hypertension   Anxiety   Depression   Subjective    HPI  She comes in today for follow-up.  Everything is stable.  Has chronic anxiety but it is overall doing well.  Takes 1 clonazepam daily according to the patient. She has no specific complaints today.  To her knowledge her blood pressures at home are good.  Hypertension, follow-up  BP Readings from Last 3 Encounters:  10/14/21 (!) 94/56  06/03/21 135/80  01/08/21 123/76   Wt Readings from Last 3 Encounters:  10/14/21 139 lb (63 kg)  06/03/21 138 lb (62.6 kg)  01/08/21 144 lb (65.3 kg)     She was last seen for hypertension 4 months ago.  Management since that visit includes; Good blood pressure control..  Outside blood pressures are not checking.  Pertinent labs Lab Results  Component Value Date   CHOL 214 (H) 04/10/2021   HDL 51 04/10/2021   LDLCALC 127 (H) 04/10/2021   TRIG 206 (H) 04/10/2021   CHOLHDL 4.2 04/10/2021   Lab Results  Component Value Date   NA 139 04/10/2021   K 4.0 04/10/2021   CREATININE 0.90 04/10/2021   EGFR 71 04/10/2021   GLUCOSE 112 (H) 04/10/2021   TSH 3.590 09/27/2019     The 10-year ASCVD risk score (Arnett DK, et al., 2019) is: 8.6%  --------------------------------------------------------------------------------------------------- Anxiety and Depression:  She was last seen for anxiety 4 months ago. Changes made at last visit include; Chronic issue.  Continue BuSpar.  I recommended she cut down on  clonazepam which she has. For depression on sertraline.   GAD-7 Results    10/14/2021    1:28 PM 01/08/2021   11:12 AM 03/31/2017    4:03 PM  GAD-7 Generalized Anxiety Disorder Screening Tool  1. Feeling Nervous, Anxious, or on Edge $Remov'1 3 1  'hInYti$ 2. Not Being Able to Stop or Control Worrying 1 0 3  3. Worrying Too Much About Different Things $RemoveBeforeDE'1 1 3  'YzvHOiUgohfOllQ$ 4. Trouble Relaxing 1 0 1  5. Being So Restless it's Hard To Sit Still 1 0 2  6. Becoming Easily Annoyed or Irritable $RemoveBefo'1 1 2  'iJQKtuCKqdV$ 7. Feeling Afraid As If Something Awful Might Happen $RemoveBefo'1 1 2  'fZJxgRDzDas$ Total GAD-7 Score $RemoveBef'7 6 14  'zXsnHXzIDX$ Difficulty At Work, Home, or Getting  Along With Others? Somewhat difficult  Somewhat difficult    PHQ-9 Scores    10/14/2021    1:14 PM 06/03/2021    9:31 AM 01/08/2021   11:12 AM  PHQ9 SCORE ONLY  PHQ-9 Total Score $RemoveBef'6 4 6    'OjBBxBYpTz$ ---------------------------------------------------------------------------------------------------   Medications: Outpatient Medications Prior to Visit  Medication Sig   aspirin EC 81 MG EC tablet Take 1 tablet (81 mg total) by mouth daily.   Cholecalciferol (VITAMIN D) 2000 UNITS tablet Take by mouth.   clonazePAM (KLONOPIN) 1 MG tablet TAKE 1 TABLET(1 MG) BY MOUTH TWICE DAILY AS NEEDED FOR ANXIETY   losartan-hydrochlorothiazide (HYZAAR) 50-12.5 MG tablet TAKE 1 TABLET BY  MOUTH EVERY DAY   omeprazole (PRILOSEC) 20 MG capsule TAKE 1 CAPSULE BY MOUTH EVERY DAY   rosuvastatin (CRESTOR) 40 MG tablet Take 1 tablet (40 mg total) by mouth daily.   sertraline (ZOLOFT) 100 MG tablet TAKE 1 TABLET BY MOUTH EVERY DAY   busPIRone (BUSPAR) 7.5 MG tablet Take 1 tablet (7.5 mg total) by mouth 2 (two) times daily as needed. (Patient not taking: Reported on 06/03/2021)   No facility-administered medications prior to visit.    Review of Systems  Constitutional:  Negative for appetite change, chills, fatigue and fever.  Respiratory:  Negative for chest tightness and shortness of breath.   Cardiovascular:  Negative for chest  pain and palpitations.  Gastrointestinal:  Negative for abdominal pain, nausea and vomiting.  Neurological:  Negative for dizziness and weakness.    Last thyroid functions Lab Results  Component Value Date   TSH 3.590 09/27/2019       Objective    BP (!) 94/56 (BP Location: Left Arm, Patient Position: Sitting, Cuff Size: Normal)   Pulse 88   Resp 16   Wt 139 lb (63 kg)   SpO2 96%   BMI 22.44 kg/m  BP Readings from Last 3 Encounters:  10/14/21 (!) 94/56  06/03/21 135/80  01/08/21 123/76   Wt Readings from Last 3 Encounters:  10/14/21 139 lb (63 kg)  06/03/21 138 lb (62.6 kg)  01/08/21 144 lb (65.3 kg)      Physical Exam Vitals reviewed.  Constitutional:      General: She is not in acute distress.    Appearance: She is well-developed.  HENT:     Head: Normocephalic and atraumatic.     Right Ear: Hearing normal.     Left Ear: Hearing normal.     Nose: Nose normal.  Eyes:     General: Lids are normal. No scleral icterus.       Right eye: No discharge.        Left eye: No discharge.     Conjunctiva/sclera: Conjunctivae normal.  Cardiovascular:     Rate and Rhythm: Normal rate and regular rhythm.     Heart sounds: Normal heart sounds.  Pulmonary:     Effort: Pulmonary effort is normal. No respiratory distress.  Abdominal:     Palpations: Abdomen is soft.  Skin:    General: Skin is warm and dry.     Findings: No lesion or rash.  Neurological:     General: No focal deficit present.     Mental Status: She is alert and oriented to person, place, and time.  Psychiatric:        Mood and Affect: Mood normal.        Speech: Speech normal.        Behavior: Behavior normal.        Thought Content: Thought content normal.        Judgment: Judgment normal.       No results found for any visits on 10/14/21.  Assessment & Plan     1. Anxiety Chronic apparently stable issue for this patient.  GAD-7 on next visit. Not increase clonazepam dosing  2. Mild episode  of recurrent major depressive disorder (HCC) Well on sertraline  3. Essential (primary) hypertension Pressure borderline low today but patient completely asymptomatic. Home blood pressure readings. 4. Anxiety disorder, unspecified type  - clonazePAM (KLONOPIN) 1 MG tablet; TAKE 1 TABLET(1 MG) BY MOUTH TWICE DAILY AS NEEDED FOR ANXIETY  Dispense: 60 tablet; Refill:  1  5. Need for influenza vaccination  - Flu Vaccine QUAD High Dose(Fluad)  6. Stenosis of left carotid artery Post carotid endarterectomy. Patient knows to quit smoking completely LDL cholesterol 76 on Crestor 40 7. Alcohol drinker Patient states she has cut back.   No follow-ups on file.      I, Wilhemena Durie, MD, have reviewed all documentation for this visit. The documentation on 10/18/21 for the exam, diagnosis, procedures, and orders are all accurate and complete.    Regnald Bowens Cranford Mon, MD  [facility] [phone] (phone) [phone] (fax)  [place]

## 2021-10-15 ENCOUNTER — Telehealth: Payer: Self-pay

## 2021-10-15 ENCOUNTER — Other Ambulatory Visit: Payer: Self-pay | Admitting: Family Medicine

## 2021-10-15 DIAGNOSIS — K219 Gastro-esophageal reflux disease without esophagitis: Secondary | ICD-10-CM

## 2021-10-15 DIAGNOSIS — I10 Essential (primary) hypertension: Secondary | ICD-10-CM

## 2021-10-15 NOTE — Telephone Encounter (Signed)
Copied from CRM [id number]. Topic: General - Other >> Oct 15, 2021  8:58 AM Pincus Sanes wrote: Reason for CRM: Pt was calling in regarding her visit and meds yesterday,she is worried about her meds being filled, it does not seem any are due but she says she is asking pharmacy for refill request to be sent to Dr Sullivan Lone so he can give her one last refill to make sure she has enough. Explained to her that Dr Roxan Hockey will be taking carl of her refills but she still wanted Dr Reece Agar to know she was requesting refills however not due.

## 2021-11-18 NOTE — Progress Notes (Signed)
Encounter created in error

## 2022-02-23 ENCOUNTER — Other Ambulatory Visit: Payer: Self-pay | Admitting: Family Medicine

## 2022-02-23 ENCOUNTER — Telehealth: Payer: Self-pay

## 2022-02-23 DIAGNOSIS — F419 Anxiety disorder, unspecified: Secondary | ICD-10-CM

## 2022-02-23 NOTE — Telephone Encounter (Signed)
Copied from [place] [id number]. Topic: General - Inquiry >> Feb 23, 2022  2:40 PM Marcellus Scott wrote: Reason for CRM: Pt is calling, requesting to speak with someone in the office. Requesting a call back as soon as possible. Pt declined to provide further details.  Possibly asking questions in regards to [facility] leaving and her est care with a different PCP.     Please advise.

## 2022-02-23 NOTE — Telephone Encounter (Signed)
Medication Refill - Medication: Clonazepam 1 mg  Has the patient contacted their pharmacy? Yes.   (Agent: If no, request that the patient contact the pharmacy for the refill. If patient does not wish to contact the pharmacy document the reason why and proceed with request.) (Agent: If yes, when and what did the pharmacy advise?)  Preferred Pharmacy (with phone number or street name): [facility] [address] Has the patient been seen for an appointment in the last year OR does the patient have an upcoming appointment? yes  Agent: Please be advised that RX refills may take up to 3 business days. We ask that you follow-up with your pharmacy.

## 2022-02-24 NOTE — Telephone Encounter (Signed)
Requested medication (s) are due for refill today: yes  Requested medication (s) are on the active medication list: yes  Last refill:  10/14/21 #60 1 refills  Future visit scheduled: yes in 1 week  Notes to clinic:  not delegated per protocol. Last ordered by Thurston Hole, MD. Do you want to refill Rx?     Requested Prescriptions  Pending Prescriptions Disp Refills   clonazePAM (KLONOPIN) 1 MG tablet 60 tablet 1    Sig: TAKE 1 TABLET(1 MG) BY MOUTH TWICE DAILY AS NEEDED FOR ANXIETY     Not Delegated - Psychiatry: Anxiolytics/Hypnotics 2 Failed - 02/23/2022  5:22 PM      Failed - This refill cannot be delegated      Failed - Urine Drug Screen completed in last 360 days      Passed - Patient is not pregnant      Passed - Valid encounter within last 6 months    Recent Outpatient Visits           4 months ago [place] Jerrol Banana., MD   8 months ago Osteoporosis, unspecified osteoporosis type, unspecified pathological fracture presence   [place] Jerrol Banana., MD   1 year ago Primary hypertension   [facility] Myles Gip, DO   1 year ago [facility] preventive visit   [facility] Jerrol Banana., MD   1 year ago Stenosis of left carotid artery   [facility] Jerrol Banana., MD       Future Appointments             In 1 week Simmons-Robinson, Riki Sheer, MD [facility]

## 2022-02-25 MED ORDER — CLONAZEPAM 1 MG PO TABS: ORAL_TABLET | ORAL | 0 refills | Status: DC

## 2022-03-04 NOTE — Progress Notes (Signed)
I,Joseline E Rosas,acting as a scribe for [employer], MD.,have documented all relevant documentation on the behalf of Eulis Foster, MD,as directed by  Eulis Foster, MD while in the presence of Eulis Foster, MD.   Established patient visit   Patient: Melanie Mitchell   DOB: [birth year]   67 y.o. Female  MRN: [id number] Visit Date: 03/05/2022  Today's healthcare provider: Eulis Foster, MD   Chief Complaint  Patient presents with   Follow-Up chronic disease   Subjective    HPI  Anxiety, Follow-up  She was last seen for anxiety 4 months ago. Changes made at last visit include continue Clonazepam 1 mg twice daily and Sertraline 100mg  for Depression. She states she has been taking one half of the setraline.    She reports good compliance with treatment.  She feels her anxiety is mild and Unchanged since last visit. She states that symptoms of anxiety and compulsions   Symptoms: No chest pain No difficulty concentrating  No dizziness No fatigue  No feelings of losing control No insomnia  No irritable No palpitations  Yes panic attacks No racing thoughts  No shortness of breath No sweating  No tremors/shakes    GAD-7 Results    03/05/2022   11:11 AM 10/14/2021    1:28 PM 01/08/2021   11:12 AM  GAD-7 Generalized Anxiety Disorder Screening Tool  1. Feeling Nervous, Anxious, or on Edge 1 1 3   2. Not Being Able to Stop or Control Worrying 1 1 0  3. Worrying Too Much About Different Things 1 1 1   4. Trouble Relaxing 1 1 0  5. Being So Restless it's Hard To Sit Still 1 1 0  6. Becoming Easily Annoyed or Irritable 0 1 1  7. Feeling Afraid As If Something Awful Might Happen 1 1 1   Total GAD-7 Score 6 7 6   Difficulty At Work, Home, or Getting  Along With Others? Not difficult at all Somewhat difficult     PHQ-9 Scores    03/05/2022   11:22 AM 10/14/2021    1:14 PM 06/03/2021    9:31 AM  PHQ9 SCORE ONLY  PHQ-9 Total  Score 7 6 4    Hypertension, follow-up  BP Readings from Last 3 Encounters:  03/05/22 104/71  10/14/21 (!) 94/56  06/03/21 135/80   Wt Readings from Last 3 Encounters:  03/05/22 139 lb 12.8 oz (63.4 kg)  10/14/21 139 lb (63 kg)  06/03/21 138 lb (62.6 kg)     She was last seen for hypertension 4 months ago.  BP at that visit was 94/56. Management since that visit includes continue current medication hyzaar 50-12.5mg  daily.  She reports good compliance with treatment.  Outside blood pressures are not being checked Symptoms: No chest pain No chest pressure  No palpitations No syncope  No dyspnea No orthopnea  No paroxysmal nocturnal dyspnea No lower extremity edema   Pertinent labs Lab Results  Component Value Date   CHOL 214 (H) 04/10/2021   HDL 51 04/10/2021   LDLCALC 127 (H) 04/10/2021   TRIG 206 (H) 04/10/2021   CHOLHDL 4.2 04/10/2021   Lab Results  Component Value Date   NA 139 04/10/2021   K 4.0 04/10/2021   CREATININE 0.90 04/10/2021   EGFR 71 04/10/2021   GLUCOSE 112 (H) 04/10/2021   TSH 3.590 09/27/2019     The 10-year ASCVD risk score (Arnett DK, et al., 2019) is: 11.2%  She has been taking 1000 units of calcium and  vitamin D  ---------------------------------------------------------------------------------------------------   Medications: Outpatient Medications Prior to Visit  Medication Sig   aspirin EC 81 MG EC tablet Take 1 tablet (81 mg total) by mouth daily.   Cholecalciferol (VITAMIN D) 2000 UNITS tablet Take by mouth.   clonazePAM (KLONOPIN) 1 MG tablet TAKE 1 TABLET(1 MG) BY MOUTH TWICE DAILY AS NEEDED FOR ANXIETY   losartan-hydrochlorothiazide (HYZAAR) 50-12.5 MG tablet TAKE 1 TABLET BY MOUTH EVERY DAY   omeprazole (PRILOSEC) 20 MG capsule TAKE 1 CAPSULE BY MOUTH EVERY DAY   rosuvastatin (CRESTOR) 40 MG tablet Take 1 tablet (40 mg total) by mouth daily.   sertraline (ZOLOFT) 100 MG tablet TAKE 1 TABLET BY MOUTH EVERY DAY   [DISCONTINUED]  busPIRone (BUSPAR) 7.5 MG tablet Take 1 tablet (7.5 mg total) by mouth 2 (two) times daily as needed. (Patient not taking: Reported on 06/03/2021)   No facility-administered medications prior to visit.    Review of Systems     Objective    BP 104/71 (BP Location: Left Arm, Patient Position: Sitting, Cuff Size: Normal)   Pulse 79   Temp 98.7 F (37.1 C)   Resp 16   Ht 5\' 7"  (1.702 m)   Wt 139 lb 12.8 oz (63.4 kg)   BMI 21.90 kg/m    Physical Exam Vitals reviewed.  Constitutional:      General: She is not in acute distress.    Appearance: Normal appearance. She is not ill-appearing, toxic-appearing or diaphoretic.  Eyes:     Conjunctiva/sclera: Conjunctivae normal.  Cardiovascular:     Rate and Rhythm: Normal rate and regular rhythm.     Pulses: Normal pulses.     Heart sounds: Normal heart sounds. No murmur heard.    No friction rub. No gallop.  Pulmonary:     Effort: Pulmonary effort is normal. No respiratory distress.     Breath sounds: Normal breath sounds. No stridor. No wheezing, rhonchi or rales.  Abdominal:     General: Bowel sounds are normal. There is no distension.     Palpations: Abdomen is soft.     Tenderness: There is no abdominal tenderness.  Musculoskeletal:     Right lower leg: No edema.     Left lower leg: No edema.  Skin:    Findings: No erythema or rash.  Neurological:     Mental Status: She is alert and oriented to person, place, and time.       No results found for any visits on 03/05/22.  Assessment & Plan     Problem List Items Addressed This Visit       Cardiovascular and Mediastinum   BP (high blood pressure)    Controlled BP at goal Continue current medications at current doses No medications changes today          Other   Anxiety - Primary    Chronic  Stable with symptoms controlled  Continue current medications including klonopin 1mg  twice daily as needed and 50mg  Zoloft daily  Patient states she has been on benzo for  several decades and they help her to function daily including grocery shopping      Need for pneumococcal 20-valent conjugate vaccination    Pneumonia  vaccine was administered today  Patient tolerated well        Relevant Orders   Pneumococcal conjugate vaccine 20-valent (Prevnar 20) (Completed)     Return in about 4 months (around 07/04/2022) for AWV .        The entirety  of the information documented in the History of Present Illness, Review of Systems and Physical Exam were personally obtained by me. Portions of this information were initially documented by Lyndel Pleasure, CMA  and reviewed by me for thoroughness and accuracy.Eulis Foster, MD     Eulis Foster, MD  [facility] [phone] (phone) [phone] (fax)  [place]

## 2022-03-05 ENCOUNTER — Encounter: Payer: Self-pay | Admitting: Family Medicine

## 2022-03-05 ENCOUNTER — Ambulatory Visit (INDEPENDENT_AMBULATORY_CARE_PROVIDER_SITE_OTHER): Payer: Medicare Other | Admitting: Family Medicine

## 2022-03-05 VITALS — BP 104/71 | HR 79 | Temp 98.7°F | Resp 16 | Ht 67.0 in | Wt 139.8 lb

## 2022-03-05 DIAGNOSIS — Z23 Encounter for immunization: Secondary | ICD-10-CM | POA: Diagnosis not present

## 2022-03-05 DIAGNOSIS — F419 Anxiety disorder, unspecified: Secondary | ICD-10-CM | POA: Diagnosis not present

## 2022-03-05 DIAGNOSIS — I10 Essential (primary) hypertension: Secondary | ICD-10-CM

## 2022-03-05 NOTE — Patient Instructions (Addendum)
   You can continue to take your combination calcium and vitamin D. Please bring your medications with you to your next appointment.   Please schedule your annual wellness visit in the next 3-4 months

## 2022-03-06 DIAGNOSIS — Z23 Encounter for immunization: Secondary | ICD-10-CM | POA: Insufficient documentation

## 2022-03-06 NOTE — Assessment & Plan Note (Signed)
Controlled BP at goal Continue current medications at current doses No medications changes today   

## 2022-03-06 NOTE — Assessment & Plan Note (Signed)
Pneumonia  vaccine was administered today  Patient tolerated well

## 2022-03-06 NOTE — Assessment & Plan Note (Signed)
Chronic  Stable with symptoms controlled  Continue current medications including klonopin 1mg  twice daily as needed and 50mg  Zoloft daily  Patient states she has been on benzo for several decades and they help her to function daily including grocery shopping

## 2022-05-14 ENCOUNTER — Other Ambulatory Visit: Payer: Self-pay | Admitting: Family Medicine

## 2022-05-14 DIAGNOSIS — F419 Anxiety disorder, unspecified: Secondary | ICD-10-CM

## 2022-05-18 ENCOUNTER — Telehealth: Payer: Self-pay | Admitting: Family Medicine

## 2022-05-19 ENCOUNTER — Telehealth: Payer: Self-pay | Admitting: Family Medicine

## 2022-05-19 DIAGNOSIS — F419 Anxiety disorder, unspecified: Secondary | ICD-10-CM

## 2022-05-19 MED ORDER — CLONAZEPAM 1 MG PO TABS: ORAL_TABLET | ORAL | 0 refills | Status: DC

## 2022-05-19 NOTE — Telephone Encounter (Signed)
Refill sent   Last fill Jan 2024 per PDMP

## 2022-05-19 NOTE — Telephone Encounter (Signed)
Pt is calling in because she needs PCP to call in her prescription for clonazePAM (KLONOPIN) 1 MG tablet [[id number]] to be [facility] to [facility] on [address] [facility]. Pt only has 4 pills left.

## 2022-05-21 ENCOUNTER — Other Ambulatory Visit: Payer: Self-pay | Admitting: Family Medicine

## 2022-05-21 DIAGNOSIS — F419 Anxiety disorder, unspecified: Secondary | ICD-10-CM

## 2022-05-21 NOTE — Telephone Encounter (Signed)
  Medication Refill - Medication: clonazePAM (KLONOPIN) 1 MG tablet  Has the patient contacted their pharmacy? Yes.   (Agent: If no, request that the patient contact the pharmacy for the refill. If patient does not wish to contact the pharmacy document the reason why and proceed with request.) (Agent: If yes, when and what did the pharmacy advise?) [facility] Now [facility]. Please have resent to [facility] below.  Preferred Pharmacy (with phone number or street name): [facility] - [place], [place] - [address] Phone: [phone]  Fax: [phone]   Has the patient been seen for an appointment in the last year OR does the patient have an upcoming appointment? Yes.    Agent: Please be advised that RX refills may take up to 3 business days. We ask that you follow-up with your pharmacy.

## 2022-05-21 NOTE — Telephone Encounter (Signed)
Resend to [facility] pharmacy.

## 2022-05-22 MED ORDER — CLONAZEPAM 1 MG PO TABS: ORAL_TABLET | ORAL | 0 refills | Status: DC

## 2022-05-22 NOTE — Telephone Encounter (Signed)
Requested medication (s) are due for refill today: resent to another pharmacy  Requested medication (s) are on the active medication list: yes  Last refill:  05/19/22  Future visit scheduled: yes  Notes to clinic:  Unable to refill per protocol, cannot delegate. Patient would like medication sent to [facility], [facility], please advise.      Requested Prescriptions  Pending Prescriptions Disp Refills   clonazePAM (KLONOPIN) 1 MG tablet 60 tablet 0    Sig: Take 1 tablet ( )  by mouth twice daily as needed     Not Delegated - Psychiatry: Anxiolytics/Hypnotics 2 Failed - 05/21/2022  1:14 PM      Failed - This refill cannot be delegated      Failed - Urine Drug Screen completed in last 360 days      Passed - Patient is not pregnant      Passed - Valid encounter within last 6 months    Recent Outpatient Visits           2 months ago Anxiety   [facility] [place], [place], MD   7 months ago Anxiety   [facility] Bosie Clos, MD   11 months ago Osteoporosis, unspecified osteoporosis type, unspecified pathological fracture presence   [place] [facility] Bosie Clos, MD   1 year ago Primary hypertension   [facility] Caro [place], DO   1 year ago Welcome to [employer] preventive visit   [facility] Bosie Clos, MD       Future Appointments             In 1 month Simmons-Robinson, Tawanna Cooler, MD [facility]

## 2022-05-29 ENCOUNTER — Other Ambulatory Visit: Payer: Self-pay | Admitting: Family Medicine

## 2022-05-29 NOTE — Telephone Encounter (Signed)
[facility] pharmacy faxed refill request for the following medications:   rosuvastatin (CRESTOR) 40 MG tablet    Please advise

## 2022-05-29 NOTE — Telephone Encounter (Signed)
Need to check pharmacy with patient before sending meds in. [facility].

## 2022-06-25 ENCOUNTER — Other Ambulatory Visit: Payer: Self-pay

## 2022-06-25 ENCOUNTER — Telehealth: Payer: Self-pay | Admitting: Family Medicine

## 2022-06-25 DIAGNOSIS — K219 Gastro-esophageal reflux disease without esophagitis: Secondary | ICD-10-CM

## 2022-06-25 MED ORDER — OMEPRAZOLE 20 MG PO CPDR: 20.0000 mg | DELAYED_RELEASE_CAPSULE | Freq: Every day | ORAL | 2 refills | Status: DC

## 2022-06-25 NOTE — Telephone Encounter (Signed)
[facility] pharmacy faxed refill request for the following medications:     omeprazole (PRILOSEC) 20 MG capsule    Please advise  

## 2022-07-07 ENCOUNTER — Encounter: Payer: Self-pay | Admitting: Family Medicine

## 2022-07-07 ENCOUNTER — Ambulatory Visit (INDEPENDENT_AMBULATORY_CARE_PROVIDER_SITE_OTHER): Payer: Medicare Other | Admitting: Family Medicine

## 2022-07-07 VITALS — BP 112/75 | HR 87 | Ht 67.0 in | Wt 136.1 lb

## 2022-07-07 DIAGNOSIS — I10 Essential (primary) hypertension: Secondary | ICD-10-CM

## 2022-07-07 DIAGNOSIS — Z131 Encounter for screening for diabetes mellitus: Secondary | ICD-10-CM

## 2022-07-07 DIAGNOSIS — E782 Mixed hyperlipidemia: Secondary | ICD-10-CM | POA: Diagnosis not present

## 2022-07-07 DIAGNOSIS — Z114 Encounter for screening for human immunodeficiency virus [HIV]: Secondary | ICD-10-CM

## 2022-07-07 DIAGNOSIS — Z1211 Encounter for screening for malignant neoplasm of colon: Secondary | ICD-10-CM

## 2022-07-07 DIAGNOSIS — Z720 Tobacco use: Secondary | ICD-10-CM | POA: Insufficient documentation

## 2022-07-07 DIAGNOSIS — E559 Vitamin D deficiency, unspecified: Secondary | ICD-10-CM

## 2022-07-07 DIAGNOSIS — Z1231 Encounter for screening mammogram for malignant neoplasm of breast: Secondary | ICD-10-CM

## 2022-07-07 DIAGNOSIS — Z0000 Encounter for general adult medical examination without abnormal findings: Secondary | ICD-10-CM | POA: Insufficient documentation

## 2022-07-07 DIAGNOSIS — Z1159 Encounter for screening for other viral diseases: Secondary | ICD-10-CM

## 2022-07-07 DIAGNOSIS — E785 Hyperlipidemia, unspecified: Secondary | ICD-10-CM

## 2022-07-07 NOTE — Progress Notes (Signed)
I,Sha'taria Tyson,acting as a Neurosurgeon for [employer], MD.,have documented all relevant documentation on the behalf of Ronnald Ramp, MD,as directed by  Ronnald Ramp, MD while in the presence of Ronnald Ramp, MD.   Annual Wellness Visit     Patient: Melanie Mitchell, Female    DOB: 1955-11-06, 67 y.o.   MRN: [id number] Visit Date: 07/07/2022  Today's Provider: Ronnald Ramp, MD   No chief complaint on file.  Subjective    Melanie Mitchell is a 67 y.o. female who presents today for her Annual Wellness Visit.  She reports consuming a general diet. The patient does not participate in regular exercise at present.  She generally feels well.  She reports sleeping well.  She does not have additional problems to discuss today.   HPI -[facility] would [facility] all [facility] Health Maintenance Colonoscopy: patient declined  Pap Smear: last completed in 2017, NILM and negative HR HPV  Covid Vaccine:has had 2 doses, last in 2021  TDAP: last dose in July 2014, counseled to have 10 year updated dose, patient plans to have this administered at the pharmacy   PNA Vaccine: 20 in 2024; 23, 08/2012  HIV Screening: patient declined. States she has previously completed Recommended Shingrix Vaccine // has apt at drug store     Medications: Outpatient Medications Prior to Visit  Medication Sig   aspirin EC 81 MG EC tablet Take 1 tablet (81 mg total) by mouth daily.   Calcium Citrate-Vitamin D (CALCIUM + D PO) Take by mouth.   clonazePAM (KLONOPIN) 1 MG tablet Take 1 tablet (1mg )  by mouth twice daily as needed   losartan-hydrochlorothiazide (HYZAAR) 50-12.5 MG tablet TAKE 1 TABLET BY MOUTH EVERY DAY   omeprazole (PRILOSEC) 20 MG capsule Take 1 capsule (20 mg total) by mouth daily.   rosuvastatin (CRESTOR) 40 MG tablet Take 1 tablet (40 mg total) by mouth daily.   sertraline (ZOLOFT) 100 MG tablet  TAKE 1 TABLET BY MOUTH EVERY DAY   Cholecalciferol (VITAMIN D) 2000 UNITS tablet Take by mouth. (Patient not taking: Reported on 07/07/2022)   No facility-administered medications prior to visit.    Allergies  Allergen Reactions   Atorvastatin Diarrhea   Oxycodone Nausea And Vomiting    Mental changes   Penicillins Other (See Comments)    Patient Care Team: Ronnald Ramp, MD as PCP - General (Family Medicine)  Review of Systems  HENT:  Positive for dental problem.   Eyes:  Positive for visual disturbance.  Psychiatric/Behavioral:  The patient is nervous/anxious.         Objective    Vitals: BP 112/75   Pulse 87   Ht 5\' 7"  (1.702 m)   Wt 136 lb 1.6 oz (61.7 kg)   BMI 21.32 kg/m     Physical Exam Vitals reviewed.  Constitutional:      General: She is not in acute distress.    Appearance: Normal appearance. She is not ill-appearing, toxic-appearing or diaphoretic.  HENT:     Head: Normocephalic and atraumatic.     Right Ear: Tympanic membrane and external ear normal. There is no impacted cerumen.     Left Ear: Tympanic membrane and external ear normal. There is no impacted cerumen.     Nose: Nose normal.     Mouth/Throat:     Pharynx: Oropharynx is clear.  Eyes:     General: No scleral icterus.    Extraocular Movements: Extraocular movements intact.  Conjunctiva/sclera: Conjunctivae normal.     Pupils: Pupils are equal, round, and reactive to light.  Cardiovascular:     Rate and Rhythm: Normal rate and regular rhythm.     Pulses: Normal pulses.     Heart sounds: Normal heart sounds. No murmur heard.    No friction rub. No gallop.  Pulmonary:     Effort: Pulmonary effort is normal. No respiratory distress.     Breath sounds: Normal breath sounds. No wheezing, rhonchi or rales.  Abdominal:     General: Bowel sounds are normal. There is no distension.     Palpations: Abdomen is soft. There is no mass.     Tenderness: There is no abdominal  tenderness. There is no guarding.  Musculoskeletal:        General: No deformity.     Cervical back: Normal range of motion and neck supple. No rigidity.     Right lower leg: No edema.     Left lower leg: No edema.  Lymphadenopathy:     Cervical: No cervical adenopathy.  Skin:    General: Skin is warm.     Capillary Refill: Capillary refill takes less than 2 seconds.     Findings: Bruising present. No erythema or rash.     Comments: Bruise noted on left shin  Neurological:     General: No focal deficit present.     Mental Status: She is alert and oriented to person, place, and time.     Motor: No weakness.     Gait: Gait normal.  Psychiatric:        Mood and Affect: Mood normal.        Behavior: Behavior normal.      Most recent functional status assessment:    03/05/2022   11:23 AM  In your present state of health, do you have any difficulty performing the following activities:  Hearing? 0  Vision? 0  Difficulty concentrating or making decisions? 0  Walking or climbing stairs? 1  Dressing or bathing? 0  Doing errands, shopping? 0   Most recent fall risk assessment:    03/05/2022   11:24 AM  Fall Risk   Falls in the past year? 0  Number falls in past yr: 0  Injury with Fall? 0  Risk for fall due to : No Fall Risks    Most recent depression screenings:    03/05/2022   11:22 AM 10/14/2021    1:14 PM  PHQ 2/9 Scores  PHQ - 2 Score 2 2  PHQ- 9 Score 7 6   Most recent cognitive screening:    10/09/2020   11:27 AM  6CIT Screen  What Year? 0 points  What month? 0 points  What time? 0 points  Count back from 20 0 points  Months in reverse 0 points  Repeat phrase 0 points  Total Score 0 points   Most recent Audit-C alcohol use screening    03/05/2022   11:24 AM  Alcohol Use Disorder Test (AUDIT)  1. How often do you have a drink containing alcohol? 3  2. How many drinks containing alcohol do you have on a typical day when you are drinking? 0  3. How often do  you have six or more drinks on one occasion? 0  AUDIT-C Score 3  4. How often during the last year have you found that you were not able to stop drinking once you had started? 0  5. How often during the last year have  you failed to do what was normally expected from you because of drinking? 0  6. How often during the last year have you needed a first drink in the morning to get yourself going after a heavy drinking session? 0  7. How often during the last year have you had a feeling of guilt of remorse after drinking? 0  8. How often during the last year have you been unable to remember what happened the night before because you had been drinking? 0  9. Have you or someone else been injured as a result of your drinking? 0  10. Has a relative or friend or a doctor or another health worker been concerned about your drinking or suggested you cut down? 0  Alcohol Use Disorder Identification Test Final Score (AUDIT) 3   A score of 3 or more in women, and 4 or more in men indicates increased risk for alcohol abuse, EXCEPT if all of the points are from question 1   No results found for any visits on 07/07/22.  Assessment & Plan      Immunization History  Administered Date(s) Administered   Fluad Quad(high Dose 65+) 01/08/2021, 10/14/2021   Influenza Whole 11/12/2015   Influenza,inj,Quad PF,6+ Mos 11/21/2014, 10/08/2016   PFIZER(Purple Top)SARS-COV-2 Vaccination 04/18/2019, 05/16/2019   PNEUMOCOCCAL CONJUGATE-20 03/05/2022   Pneumococcal Polysaccharide-23 08/15/2012   Tdap 08/15/2012    Health Maintenance  Topic Date Due   Hepatitis C Screening  Never done   Colonoscopy  Never done   Zoster Vaccines- Shingrix (1 of 2) Never done   COVID-19 Vaccine (3 - 2023-24 season) 10/03/2021   MAMMOGRAM  06/11/2022   DTaP/Tdap/Td (2 - Td or Tdap) 08/16/2022   INFLUENZA VACCINE  09/03/2022   Medicare Annual Wellness (AWV)  07/07/2023   Pneumonia Vaccine 63+ Years old  Completed   DEXA SCAN   Completed   HPV VACCINES  Aged Out     Problem List Items Addressed This Visit       Cardiovascular and Mediastinum   BP (high blood pressure)   Relevant Orders   Comprehensive metabolic panel     Other   HLD (hyperlipidemia)   Relevant Orders   Lipid Profile   Avitaminosis D   Encounter for annual wellness visit (AWV) in Medicare patient - Primary    Annual wellness visit completed today including all of the following: -Reviewed patient's family medical history -Reviewed and updated patient's list of medical providers -Completed assessment of cognitive impairment -Completed assessment of patient's functional ability -Provided patient with recommendations for health screening services as well as vaccines Health risk assessment completed and reviewed -Discussed recommendations for well-balanced diet in addition to 150 minutes of physical activity per week -Labs ordered and other age-appropriate screenings including: hepatitis C screening, mammogram ordered for breast cancer screening and patient declines colonoscopy referral however is agreeable to ordering Cologuard for colon cancer screening        Tobacco abuse   Relevant Orders   CBC   Other Visit Diagnoses     Encounter for hepatitis C screening test for low risk patient       Relevant Orders   Hepatitis C Antibody   Screening for HIV (human immunodeficiency virus)       Screening for colon cancer       Relevant Orders   Cologuard   Screening for diabetes mellitus       Relevant Orders   Hemoglobin A1c   Encounter for screening mammogram for malignant  neoplasm of breast       Relevant Orders   MM 3D SCREENING MAMMOGRAM BILATERAL BREAST        Return in about 4 months (around 11/06/2022) for anxiety, HTN.       The entirety of the information documented in the History of Present Illness, Review of Systems and Physical Exam were personally obtained by me. Portions of this information were initially  documented by Acey Lav, CMA  . I, Ronnald Ramp, MD have reviewed the documentation above for thoroughness and accuracy.    Ronnald Ramp, MD  [facility] [phone] (phone) [phone] (fax)  [facility]

## 2022-07-07 NOTE — Patient Instructions (Addendum)
I have ordered a cologuard for your colon cancer screening.   Please ask your pharmacist about the shingrix and an updated tetanus vaccine.   Please see me in 4 months for anxiety and blood pressure follow up  Health Maintenance for Postmenopausal Women Menopause is a normal process in which your ability to get pregnant comes to an end. This process happens slowly over many months or years, usually between the ages of 79 and 34. Menopause is complete when you have missed your menstrual period for 12 months. It is important to talk with your health care provider about some of the most common conditions that affect women after menopause (postmenopausal women). These include heart disease, cancer, and bone loss (osteoporosis). Adopting a healthy lifestyle and getting preventive care can help to promote your health and wellness. The actions you take can also lower your chances of developing some of these common conditions. What are the signs and symptoms of menopause? During menopause, you may have the following symptoms: Hot flashes. These can be moderate or severe. Night sweats. Decrease in sex drive. Mood swings. Headaches. Tiredness (fatigue). Irritability. Memory problems. Problems falling asleep or staying asleep. Talk with your health care provider about treatment options for your symptoms. Do I need hormone replacement therapy? Hormone replacement therapy is effective in treating symptoms that are caused by menopause, such as hot flashes and night sweats. Hormone replacement carries certain risks, especially as you become older. If you are thinking about using estrogen or estrogen with progestin, discuss the benefits and risks with your health care provider. How can I reduce my risk for heart disease and stroke? The risk of heart disease, heart attack, and stroke increases as you age. One of the causes may be a change in the body's hormones during menopause. This can affect how your body  uses dietary fats, triglycerides, and cholesterol. Heart attack and stroke are medical emergencies. There are many things that you can do to help prevent heart disease and stroke. Watch your blood pressure High blood pressure causes heart disease and increases the risk of stroke. This is more likely to develop in people who have high blood pressure readings or are overweight. Have your blood pressure checked: Every 3-5 years if you are 96-58 years of age. Every year if you are 51 years old or older. Eat a healthy diet  Eat a diet that includes plenty of vegetables, fruits, low-fat dairy products, and lean protein. Do not eat a lot of foods that are high in solid fats, added sugars, or sodium. Get regular exercise Get regular exercise. This is one of the most important things you can do for your health. Most adults should: Try to exercise for at least 150 minutes each week. The exercise should increase your heart rate and make you sweat (moderate-intensity exercise). Try to do strengthening exercises at least twice each week. Do these in addition to the moderate-intensity exercise. Spend less time sitting. Even light physical activity can be beneficial. Other tips Work with your health care provider to achieve or maintain a healthy weight. Do not use any products that contain nicotine or tobacco. These products include cigarettes, chewing tobacco, and vaping devices, such as e-cigarettes. If you need help quitting, ask your health care provider. Know your numbers. Ask your health care provider to check your cholesterol and your blood sugar (glucose). Continue to have your blood tested as directed by your health care provider. Do I need screening for cancer? Depending on your health history  and family history, you may need to have cancer screenings at different stages of your life. This may include screening for: Breast cancer. Cervical cancer. Lung cancer. Colorectal cancer. What is my risk  for osteoporosis? After menopause, you may be at increased risk for osteoporosis. Osteoporosis is a condition in which bone destruction happens more quickly than new bone creation. To help prevent osteoporosis or the bone fractures that can happen because of osteoporosis, you may take the following actions: If you are 72-30 years old, get at least 1,000 mg of calcium and at least 600 international units (IU) of vitamin D per day. If you are older than age 67 but younger than age 57, get at least 1,200 mg of calcium and at least 600 international units (IU) of vitamin D per day. If you are older than age 102, get at least 1,200 mg of calcium and at least 800 international units (IU) of vitamin D per day. Smoking and drinking excessive alcohol increase the risk of osteoporosis. Eat foods that are rich in calcium and vitamin D, and do weight-bearing exercises several times each week as directed by your health care provider. How does menopause affect my mental health? Depression may occur at any age, but it is more common as you become older. Common symptoms of depression include: Feeling depressed. Changes in sleep patterns. Changes in appetite or eating patterns. Feeling an overall lack of motivation or enjoyment of activities that you previously enjoyed. Frequent crying spells. Talk with your health care provider if you think that you are experiencing any of these symptoms. General instructions See your health care provider for regular wellness exams and vaccines. This may include: Scheduling regular health, dental, and eye exams. Getting and maintaining your vaccines. These include: Influenza vaccine. Get this vaccine each year before the flu season begins. Pneumonia vaccine. Shingles vaccine. Tetanus, diphtheria, and pertussis (Tdap) booster vaccine. Your health care provider may also recommend other immunizations. Tell your health care provider if you have ever been abused or do not feel safe  at home. Summary Menopause is a normal process in which your ability to get pregnant comes to an end. This condition causes hot flashes, night sweats, decreased interest in sex, mood swings, headaches, or lack of sleep. Treatment for this condition may include hormone replacement therapy. Take actions to keep yourself healthy, including exercising regularly, eating a healthy diet, watching your weight, and checking your blood pressure and blood sugar levels. Get screened for cancer and depression. Make sure that you are up to date with all your vaccines. This information is not intended to replace advice given to you by your health care provider. Make sure you discuss any questions you have with your health care provider. Document Revised: 06/10/2020 Document Reviewed: 06/10/2020 Elsevier Patient Education  2024 [employer].

## 2022-07-07 NOTE — Assessment & Plan Note (Signed)
Annual wellness visit completed today including all of the following: -Reviewed patient's family medical history -Reviewed and updated patient's list of medical providers -Completed assessment of cognitive impairment -Completed assessment of patient's functional ability -Provided patient with recommendations for health screening services as well as vaccines Health risk assessment completed and reviewed -Discussed recommendations for well-balanced diet in addition to 150 minutes of physical activity per week -Labs ordered and other age-appropriate screenings including: hepatitis C screening, mammogram ordered for breast cancer screening and patient declines colonoscopy referral however is agreeable to ordering Cologuard for colon cancer screening

## 2022-07-08 LAB — COMPREHENSIVE METABOLIC PANEL
ALT: 10 IU/L (ref 0–32)
AST: 29 IU/L (ref 0–40)
Albumin/Globulin Ratio: 1.8 (ref 1.2–2.2)
Albumin: 4.4 g/dL (ref 3.9–4.9)
Alkaline Phosphatase: 114 IU/L (ref 44–121)
BUN/Creatinine Ratio: 17 (ref 12–28)
BUN: 16 mg/dL (ref 8–27)
Bilirubin Total: 0.5 mg/dL (ref 0.0–1.2)
CO2: 24 mmol/L (ref 20–29)
Calcium: 9.3 mg/dL (ref 8.7–10.3)
Chloride: 92 mmol/L — ABNORMAL LOW (ref 96–106)
Creatinine, Ser: 0.94 mg/dL (ref 0.57–1.00)
Globulin, Total: 2.4 g/dL (ref 1.5–4.5)
Glucose: 104 mg/dL — ABNORMAL HIGH (ref 70–99)
Potassium: 3.9 mmol/L (ref 3.5–5.2)
Sodium: 132 mmol/L — ABNORMAL LOW (ref 134–144)
Total Protein: 6.8 g/dL (ref 6.0–8.5)
eGFR: 67 mL/min/{1.73_m2} (ref >59)

## 2022-07-08 LAB — CBC
Hematocrit: 42.1 % (ref 34.0–46.6)
Hemoglobin: 13.9 g/dL (ref 11.1–15.9)
MCH: 29.4 pg (ref 26.6–33.0)
MCHC: 33 g/dL (ref 31.5–35.7)
MCV: 89 fL (ref 79–97)
Platelets: 208 10*3/uL (ref 150–450)
RBC: 4.73 x10E6/uL (ref 3.77–5.28)
RDW: 12.9 % (ref 11.7–15.4)
WBC: 6.1 10*3/uL (ref 3.4–10.8)

## 2022-07-08 LAB — LIPID PANEL
Chol/HDL Ratio: 2.4 ratio (ref 0.0–4.4)
Cholesterol, Total: 131 mg/dL (ref 100–199)
HDL: 54 mg/dL (ref >39)
LDL Chol Calc (NIH): 56 mg/dL (ref 0–99)
Triglycerides: 120 mg/dL (ref 0–149)
VLDL Cholesterol Cal: 21 mg/dL (ref 5–40)

## 2022-07-08 LAB — HEMOGLOBIN A1C
Est. average glucose Bld gHb Est-mCnc: 134 mg/dL
Hgb A1c MFr Bld: 6.3 % — ABNORMAL HIGH (ref 4.8–5.6)

## 2022-07-08 LAB — HEPATITIS C ANTIBODY: Hep C Virus Ab: NONREACTIVE

## 2022-07-09 ENCOUNTER — Telehealth: Payer: Self-pay

## 2022-07-09 NOTE — Telephone Encounter (Signed)
Pt requested hard copy of lab work sent to her home address.

## 2022-07-09 NOTE — Telephone Encounter (Signed)
Pt given lab results per notes of Dr. Neita Garnet on 07/09/22. Pt verbalized understanding.Discussed sources of carbohydrates and things that she eats that she needs to cut back on (eats 2 ice cream comes every night), insulin resistance and importance of aerobic exercise.

## 2022-07-20 ENCOUNTER — Other Ambulatory Visit: Payer: Self-pay | Admitting: Family Medicine

## 2022-07-20 DIAGNOSIS — F419 Anxiety disorder, unspecified: Secondary | ICD-10-CM

## 2022-09-08 ENCOUNTER — Other Ambulatory Visit: Payer: Self-pay | Admitting: Family Medicine

## 2022-09-08 NOTE — Telephone Encounter (Signed)
Medication Refill - Medication: rosuvastatin (CRESTOR) 40 MG tablet [[id number]]   Has the patient contacted their pharmacy? Yes.   (Agent: If no, request that the patient contact the pharmacy for the refill. If patient does not wish to contact the pharmacy document the reason why and proceed with request.) (Agent: If yes, when and what did the pharmacy advise?)  Preferred Pharmacy (with phone number or street name):  [facility] - [place], [place] - [address] Phone: [phone]  Fax: [phone]     Has the patient been seen for an appointment in the last year OR does the patient have an upcoming appointment? Yes.    Agent: Please be advised that RX refills may take up to 3 business days. We ask that you follow-up with your pharmacy.

## 2022-09-09 MED ORDER — ROSUVASTATIN CALCIUM 40 MG PO TABS: 40.0000 mg | ORAL_TABLET | Freq: Every day | ORAL | 3 refills | Status: DC

## 2022-09-09 NOTE — Telephone Encounter (Signed)
Requested Prescriptions  Pending Prescriptions Disp Refills   rosuvastatin (CRESTOR) 40 MG tablet 90 tablet 3    Sig: Take 1 tablet (40 mg total) by mouth daily.     Cardiovascular:  Antilipid - Statins 2 Failed - 09/08/2022  5:24 PM      Failed - Lipid Panel in normal range within the last 12 months    Cholesterol, Total  Date Value Ref Range Status  07/07/2022 131 100 - 199 mg/dL Final   LDL Chol Calc (NIH)  Date Value Ref Range Status  07/07/2022 56 0 - 99 mg/dL Final   HDL  Date Value Ref Range Status  07/07/2022 54 >39 mg/dL Final   Triglycerides  Date Value Ref Range Status  07/07/2022 120 0 - 149 mg/dL Final         Passed - Cr in normal range and within 360 days    Creatinine, Ser  Date Value Ref Range Status  07/07/2022 0.94 0.57 - 1.00 mg/dL Final         Passed - Patient is not pregnant      Passed - Valid encounter within last 12 months    Recent Outpatient Visits           2 months ago Encounter for annual wellness visit (AWV) in Medicare patient   [place] [facility] Simmons-Robinson, [place], MD   6 months ago Anxiety   [place] [facility] [place], [place], MD   11 months ago Anxiety   [place] [facility] Bosie Clos, [place]   1 year ago Osteoporosis, unspecified osteoporosis type, unspecified pathological fracture presence    [facility] Bosie Clos, [place]   1 year ago Primary hypertension   [facility] Caro [place], [place]

## 2022-09-30 ENCOUNTER — Other Ambulatory Visit: Payer: Self-pay | Admitting: Family Medicine

## 2022-09-30 DIAGNOSIS — F419 Anxiety disorder, unspecified: Secondary | ICD-10-CM

## 2022-11-16 DIAGNOSIS — Z1231 Encounter for screening mammogram for malignant neoplasm of breast: Secondary | ICD-10-CM | POA: Diagnosis not present

## 2022-11-16 LAB — HM MAMMOGRAPHY

## 2022-11-27 DIAGNOSIS — K080 Exfoliation of teeth due to systemic causes: Secondary | ICD-10-CM | POA: Diagnosis not present

## 2022-12-10 DIAGNOSIS — K080 Exfoliation of teeth due to systemic causes: Secondary | ICD-10-CM | POA: Diagnosis not present

## 2022-12-28 ENCOUNTER — Other Ambulatory Visit: Payer: Self-pay | Admitting: Family Medicine

## 2022-12-28 MED ORDER — SERTRALINE HCL 100 MG PO TABS: ORAL_TABLET | ORAL | 1 refills | Status: DC

## 2022-12-28 NOTE — Telephone Encounter (Signed)
[facility] is asking for refills on Sertraline 100 mg.

## 2023-03-30 ENCOUNTER — Other Ambulatory Visit: Payer: Self-pay | Admitting: Family Medicine

## 2023-03-30 DIAGNOSIS — F419 Anxiety disorder, unspecified: Secondary | ICD-10-CM

## 2023-03-30 DIAGNOSIS — I10 Essential (primary) hypertension: Secondary | ICD-10-CM

## 2023-03-30 NOTE — Telephone Encounter (Signed)
 Copied from CRM [id number]. Topic: Clinical - Medication Refill >> Mar 30, 2023  2:38 PM Higinio Roger wrote: Most Recent Primary Care Visit:  Provider: Ronnald Ramp  Department: [facility]  Visit Type: PHYSICAL/ANNUAL WELLNESS  Date: 07/07/2022  Medication:   clonazePAM (KLONOPIN) 1 MG tablet  losartan-hydrochlorothiazide (HYZAAR) 50-12.5 MG tablet   Has the patient contacted their pharmacy? No (Agent: If no, request that the patient contact the pharmacy for the refill. If patient does not wish to contact the pharmacy document the reason why and proceed with request.) (Agent: If yes, when and what did the pharmacy advise?)  Is this the correct pharmacy for this prescription? Yes If no, delete pharmacy and type the correct one.  This is the patient's preferred pharmacy:   [facility] - [place], [place] - [address] [place] [address] Phone: [phone] Fax: [phone]   Has the prescription been filled recently? No  Is the patient out of the medication? No  Has the patient been seen for an appointment in the last year OR does the patient have an upcoming appointment? Yes  Can we respond through MyChart? No  Agent: Please be advised that Rx refills may take up to 3 business days. We ask that you follow-up with your pharmacy.

## 2023-03-31 ENCOUNTER — Other Ambulatory Visit: Payer: Self-pay | Admitting: Family Medicine

## 2023-03-31 DIAGNOSIS — F419 Anxiety disorder, unspecified: Secondary | ICD-10-CM

## 2023-04-01 MED ORDER — CLONAZEPAM 1 MG PO TABS: ORAL_TABLET | ORAL | 0 refills | Status: DC

## 2023-04-01 MED ORDER — LOSARTAN POTASSIUM-HCTZ 50-12.5 MG PO TABS: 1.0000 | ORAL_TABLET | Freq: Every day | ORAL | 1 refills | Status: DC

## 2023-04-01 NOTE — Telephone Encounter (Signed)
 Requested medication (s) are due for refill today - yes  Requested medication (s) are on the active medication list -yes  Future visit scheduled -no  Last refill: 10/01/22 #60  Notes to clinic: non delegated Rx  Requested Prescriptions  Pending Prescriptions Disp Refills   clonazePAM (KLONOPIN) 1 MG tablet [Pharmacy Med Name: CLONAZEPAM 1 MG TAB] 60 tablet     Sig: TAKE ONE TABLET BY MOUTH TWICE DAILY AS NEEDED     Not Delegated - Psychiatry: Anxiolytics/Hypnotics 2 Failed - 04/01/2023 11:48 AM      Failed - This refill cannot be delegated      Failed - Urine Drug Screen completed in last 360 days      Failed - Valid encounter within last 6 months    Recent Outpatient Visits           8 months ago Encounter for annual wellness visit (AWV) in Medicare patient   [facility] Simmons-Robinson, [place], [place]   1 year ago Anxiety   [facility] [place], [place], [place]   1 year ago Anxiety   [facility] Maple [place]., MD   1 year ago Osteoporosis, unspecified osteoporosis type, unspecified pathological fracture presence   [facility] Maple Hudson., MD   2 years ago Primary hypertension   [facility] Caro [place], [place] - Patient is not pregnant         Requested Prescriptions  Pending Prescriptions Disp Refills   clonazePAM (KLONOPIN) 1 MG tablet [Pharmacy Med Name: CLONAZEPAM 1 MG TAB] 60 tablet     Sig: TAKE ONE TABLET BY MOUTH TWICE DAILY AS NEEDED     Not Delegated - Psychiatry: Anxiolytics/Hypnotics 2 Failed - 04/01/2023 11:48 AM      Failed - This refill cannot be delegated      Failed - Urine Drug Screen completed in last 360 days      Failed - Valid encounter within last 6 months    Recent Outpatient Visits           8 months ago Encounter for annual wellness visit (AWV) in Medicare  patient   [place] [facility] Simmons-Robinson, [place], MD   1 year ago Anxiety   [place] [facility] [place], [place], MD   1 year ago Anxiety   [facility] [facility] Maple Hudson., MD   1 year ago Osteoporosis, unspecified osteoporosis type, unspecified pathological fracture presence   [facility] Maple Hudson., MD   2 years ago Primary hypertension   [facility] [place], [place] - Patient is not pregnant

## 2023-05-03 ENCOUNTER — Ambulatory Visit: Payer: 59 | Admitting: Family Medicine

## 2023-06-04 ENCOUNTER — Ambulatory Visit (INDEPENDENT_AMBULATORY_CARE_PROVIDER_SITE_OTHER): Admitting: Family Medicine

## 2023-06-04 ENCOUNTER — Encounter: Payer: Self-pay | Admitting: Family Medicine

## 2023-06-04 VITALS — BP 124/64 | HR 84 | Ht 67.0 in | Wt 138.8 lb

## 2023-06-04 DIAGNOSIS — K219 Gastro-esophageal reflux disease without esophagitis: Secondary | ICD-10-CM | POA: Diagnosis not present

## 2023-06-04 DIAGNOSIS — F419 Anxiety disorder, unspecified: Secondary | ICD-10-CM

## 2023-06-04 DIAGNOSIS — E782 Mixed hyperlipidemia: Secondary | ICD-10-CM

## 2023-06-04 DIAGNOSIS — E559 Vitamin D deficiency, unspecified: Secondary | ICD-10-CM

## 2023-06-04 DIAGNOSIS — I10 Essential (primary) hypertension: Secondary | ICD-10-CM

## 2023-06-04 DIAGNOSIS — I6522 Occlusion and stenosis of left carotid artery: Secondary | ICD-10-CM

## 2023-06-04 MED ORDER — OMEPRAZOLE 20 MG PO CPDR
20.0000 mg | DELAYED_RELEASE_CAPSULE | Freq: Every day | ORAL | 2 refills | Status: AC
Start: 2023-06-04 — End: ?

## 2023-06-04 MED ORDER — CLONAZEPAM 1 MG PO TABS: ORAL_TABLET | ORAL | 2 refills | Status: DC

## 2023-06-04 NOTE — Progress Notes (Unsigned)
 Established patient visit   Patient: Melanie Mitchell   DOB: 1955-07-26   67 y.o. Female  MRN: [id number] Visit Date: 06/04/2023  Today's healthcare provider: Mimi Alt, MD   Chief Complaint  Patient presents with  . Medical Management of Chronic Issues  . Hypertension    She does not monitor.  No symptoms to report  . Hyperlipidemia    No symptoms to report  . Anxiety    She feels her anxiety is mild and Unchanged since last visit. No symptoms present unless she has not taken medications. If she does not take them she will have panic attacks and tremors/shakes  . Gastroesophageal Reflux   Subjective     HPI     Hypertension    Additional comments: She does not monitor.  No symptoms to report        Hyperlipidemia    Additional comments: No symptoms to report        Anxiety    Additional comments: She feels her anxiety is mild and Unchanged since last visit. No symptoms present unless she has not taken medications. If she does not take them she will have panic attacks and tremors/shakes      Last edited by Pasty Bongo, CMA on 06/04/2023  1:48 PM.       Discussed the use of AI scribe software for clinical note transcription with the patient, who gave verbal consent to proceed.  History of Present Illness      Past Medical History:  Diagnosis Date  . Abnormal LFTs    (Pt told to cut back on alcohol and tylenol .)  . Anxiety   . Arthritis   . Depression   . GERD (gastroesophageal reflux disease)   . Hemorrhoids   . Hyperlipidemia   . Hypertension   . Vitamin D  deficiency     Medications: Outpatient Medications Prior to Visit  Medication Sig  . aspirin  EC 81 MG EC tablet Take 1 tablet (81 mg total) by mouth daily.  . Calcium  Citrate-Vitamin D  (CALCIUM  + D PO) Take by mouth.  . Cholecalciferol  (VITAMIN D ) 2000 UNITS tablet Take by mouth.  . losartan -hydrochlorothiazide  (HYZAAR) 50-12.5 MG tablet Take 1 tablet by mouth  daily.  . rosuvastatin  (CRESTOR ) 40 MG tablet Take 1 tablet (40 mg total) by mouth daily.  . sertraline  (ZOLOFT ) 100 MG tablet TAKE 1 TABLET BY MOUTH EVERY DAY  . [DISCONTINUED] clonazePAM  (KLONOPIN ) 1 MG tablet TAKE ONE TABLET BY MOUTH TWICE DAILY AS NEEDED  . [DISCONTINUED] omeprazole  (PRILOSEC) 20 MG capsule Take 1 capsule (20 mg total) by mouth daily.   No facility-administered medications prior to visit.    Review of Systems  Last CBC Lab Results  Component Value Date   WBC 6.1 07/07/2022   HGB 13.9 07/07/2022   HCT 42.1 07/07/2022   MCV 89 07/07/2022   MCH 29.4 07/07/2022   RDW 12.9 07/07/2022   PLT 208 07/07/2022   Last metabolic panel Lab Results  Component Value Date   GLUCOSE 104 (H) 07/07/2022   NA 132 (L) 07/07/2022   K 3.9 07/07/2022   CL 92 (L) 07/07/2022   CO2 24 07/07/2022   BUN 16 07/07/2022   CREATININE 0.94 07/07/2022   EGFR 67 07/07/2022   CALCIUM  9.3 07/07/2022   PROT 6.8 07/07/2022   ALBUMIN 4.4 07/07/2022   LABGLOB 2.4 07/07/2022   AGRATIO 1.8 07/07/2022   BILITOT 0.5 07/07/2022   ALKPHOS 114 07/07/2022   AST 29  07/07/2022   ALT 10 07/07/2022   ANIONGAP 5 04/06/2015   Last lipids Lab Results  Component Value Date   CHOL 131 07/07/2022   HDL 54 07/07/2022   LDLCALC 56 07/07/2022   TRIG 120 07/07/2022   CHOLHDL 2.4 07/07/2022   Last hemoglobin A1c Lab Results  Component Value Date   HGBA1C 6.3 (H) 07/07/2022   Last thyroid functions Lab Results  Component Value Date   TSH 3.590 09/27/2019     {See past labs  Heme  Chem  Endocrine  Serology  Results Review (optional):1}   Objective    BP 124/64 (BP Location: Left Arm, Patient Position: Sitting, Cuff Size: Normal)   Pulse 84   Ht 5\' 7"  (1.702 m)   Wt 138 lb 12.8 oz (63 kg)   SpO2 100%   BMI 21.74 kg/m  BP Readings from Last 3 Encounters:  06/04/23 124/64  07/07/22 112/75  03/05/22 104/71   Wt Readings from Last 3 Encounters:  06/04/23 138 lb 12.8 oz (63 kg)   07/07/22 136 lb 1.6 oz (61.7 kg)  03/05/22 139 lb 12.8 oz (63.4 kg)    {See vitals history (optional):1}    Physical Exam Vitals reviewed.  Constitutional:      General: She is not in acute distress.    Appearance: Normal appearance. She is not ill-appearing, toxic-appearing or diaphoretic.  Eyes:     Conjunctiva/sclera: Conjunctivae normal.  Cardiovascular:     Rate and Rhythm: Normal rate and regular rhythm.     Pulses: Normal pulses.     Heart sounds: Normal heart sounds. No murmur heard.    No friction rub. No gallop.  Pulmonary:     Effort: Pulmonary effort is normal. No respiratory distress.     Breath sounds: Normal breath sounds. No stridor. No wheezing, rhonchi or rales.  Abdominal:     General: Bowel sounds are normal. There is no distension.     Palpations: Abdomen is soft.     Tenderness: There is no abdominal tenderness.  Musculoskeletal:     Right lower leg: No edema.     Left lower leg: No edema.  Skin:    Findings: No erythema or rash.  Neurological:     Mental Status: She is alert and oriented to person, place, and time.  Psychiatric:        Mood and Affect: Mood and affect normal.        Speech: Speech normal.        Behavior: Behavior normal. Behavior is cooperative.      No results found for any visits on 06/04/23.  Assessment & Plan     Problem List Items Addressed This Visit       Digestive   Acid reflux   Relevant Medications   omeprazole  (PRILOSEC) 20 MG capsule     Other   Anxiety disorder   Relevant Medications   clonazePAM  (KLONOPIN ) 1 MG tablet    Assessment & Plan      Return in about 3 months (around 09/04/2023) for AWV.         Mimi Alt, MD  [facility] [phone] (phone) [phone] (fax)  [facility]

## 2023-06-05 NOTE — Assessment & Plan Note (Signed)
  Vitamin D  deficiency Chronic vitamin D  deficiency, managed with supplementation. - Continue vitamin D  2000 units daily

## 2023-06-05 NOTE — Assessment & Plan Note (Signed)
 Chronic anxiety and Panic disorder Mild and well-managed panic disorder. Symptoms occur if medication is missed, including tremors and panic attacks. - Continue clonazepam  1 mg twice daily as needed for increased anxiety - Refill clonazepam  1 mg prescription

## 2023-06-05 NOTE — Assessment & Plan Note (Signed)
  Hyperlipidemia Chronic hyperlipidemia, well-managed with current treatment. - Continue Crestor  40 mg daily

## 2023-06-05 NOTE — Assessment & Plan Note (Signed)
 Coronary artery disease chronic Well-managed coronary artery disease with ongoing cardiology follow-up. - Continue aspirin  81 mg daily - Continue cardiology follow-up

## 2023-06-05 NOTE — Assessment & Plan Note (Signed)
 Hypertension Chronic hypertension, well-controlled with current medication regimen. Blood pressure is stable at 124/64 mmHg. - Continue losartan  50 mg-hydrochlorothiazide  12.5 mg daily

## 2023-06-05 NOTE — Assessment & Plan Note (Signed)
  Gastroesophageal reflux disease (GERD), chronic Chronic GERD, symptoms recur if medication is missed. Requires ongoing management with medication. - Continue Prilosec 20 mg daily - Refill Prilosec 20 mg prescription

## 2023-08-30 ENCOUNTER — Encounter: Admitting: Family Medicine

## 2023-09-07 ENCOUNTER — Encounter: Admitting: Family Medicine

## 2023-09-14 DIAGNOSIS — M25551 Pain in right hip: Secondary | ICD-10-CM | POA: Diagnosis not present

## 2023-09-15 ENCOUNTER — Other Ambulatory Visit: Payer: Self-pay | Admitting: Family Medicine

## 2023-09-15 DIAGNOSIS — I10 Essential (primary) hypertension: Secondary | ICD-10-CM

## 2023-09-16 ENCOUNTER — Telehealth: Payer: Self-pay

## 2023-09-16 NOTE — Telephone Encounter (Signed)
 Copied from CRM [id number]. Topic: Clinical - Medical Advice >> Sep 16, 2023  4:07 PM Melanie Mitchell wrote: Reason for CRM: patient called stated she fell 6/25 and ended up at [facility] on Tuesday for xrays because she had pain in her groin. They put her on Methy-Prednisone  4mg  and Naproxen 500mg  and patient wants to know if she should be taking all that medication with the other medications she is already on. Patient would like for provider to call her before she leaves the office today

## 2023-09-17 ENCOUNTER — Telehealth: Payer: Self-pay | Admitting: Family Medicine

## 2023-09-17 NOTE — Telephone Encounter (Signed)
 2nd attempt to contact patient today.

## 2023-09-17 NOTE — Telephone Encounter (Signed)
 Copied from CRM #[id number]. Topic: General - Other >> Sep 17, 2023  3:18 PM [name] wrote: Reason for CRM: Patient is calling in returning a call from the office. Patient received provider's message and understands.

## 2023-09-17 NOTE — Telephone Encounter (Signed)
 LMTCB-Ok for E2C2 to give patient provider's message.

## 2023-09-17 NOTE — Telephone Encounter (Signed)
 Medication list reviewed, patient is ok to take long term meds with orthopedic prescribed meds

## 2023-09-22 NOTE — Telephone Encounter (Signed)
 Attempted to contact the pt a 3rd time, was only able to leave a voice message informing her of the message per Dr R reading her taking medication

## 2023-10-12 ENCOUNTER — Encounter: Admitting: Family Medicine

## 2023-10-21 ENCOUNTER — Other Ambulatory Visit: Payer: Self-pay | Admitting: Family Medicine

## 2023-11-24 ENCOUNTER — Ambulatory Visit

## 2023-11-24 DIAGNOSIS — Z1231 Encounter for screening mammogram for malignant neoplasm of breast: Secondary | ICD-10-CM

## 2023-11-24 DIAGNOSIS — Z0000 Encounter for general adult medical examination without abnormal findings: Secondary | ICD-10-CM | POA: Diagnosis not present

## 2023-11-24 NOTE — Patient Instructions (Signed)
 Melanie Mitchell,  Thank you for taking the time for your Medicare Wellness Visit. I appreciate your continued commitment to your health goals. Please review the care plan we discussed, and feel free to reach out if I can assist you further.  Medicare recommends these wellness visits once per year to help you and your care team stay ahead of potential health issues. These visits are designed to focus on prevention, allowing your provider to concentrate on managing your acute and chronic conditions during your regular appointments.  Please note that Annual Wellness Visits do not include a physical exam. Some assessments may be limited, especially if the visit was conducted virtually. If needed, we may recommend a separate in-person follow-up with your provider.  Ongoing Care Seeing your primary care provider every 3 to 6 months helps us  monitor your health and provide consistent, personalized care.   Referrals If a referral was made during today's visit and you haven't received any updates within two weeks, please contact the referred provider directly to check on the status.  Recommended Screenings:  Health Maintenance  Topic Date Due   Cologuard (Stool DNA test)  Never done   Zoster (Shingles) Vaccine (2 of 2) 10/14/2022   Flu Shot  09/03/2023   COVID-19 Vaccine (3 - 2025-26 season) 10/04/2023   Breast Cancer Screening  11/16/2023   Medicare Annual Wellness Visit  11/23/2024   DEXA scan (bone density measurement)  06/11/2026   DTaP/Tdap/Td vaccine (3 - Td or Tdap) 07/23/2032   Pneumococcal Vaccine for age over 6  Completed   Hepatitis C Screening  Completed   Meningitis B Vaccine  Aged Out     Advance Care Planning is important because it: Ensures you receive medical care that aligns with your values, goals, and preferences. Provides guidance to your family and loved ones, reducing the emotional burden of decision-making during critical moments.  Vision: Annual vision screenings  are recommended for early detection of glaucoma, cataracts, and diabetic retinopathy. These exams can also reveal signs of chronic conditions such as diabetes and high blood pressure.  Dental: Annual dental screenings help detect early signs of oral cancer, gum disease, and other conditions linked to overall health, including heart disease and diabetes.  Please see the attached documents for additional preventive care recommendations.   NEXT AWV 11/28/24 @ 3:50 PM BY PHONE

## 2023-11-24 NOTE — Progress Notes (Signed)
 Subjective:   Melanie Mitchell is a 68 y.o. who presents for a Medicare Wellness preventive visit.  As a reminder, Annual Wellness Visits don't include a physical exam, and some assessments may be limited, especially if this visit is performed virtually. We may recommend an in-person follow-up visit with your provider if needed.  Visit Complete: Virtual I connected with  Melanie Mitchell on 11/24/23 by a audio enabled telemedicine application and verified that I am speaking with the correct person using two identifiers.  Patient Location: Home  Provider Location: Office/Clinic  I discussed the limitations of evaluation and management by telemedicine. The patient expressed understanding and agreed to proceed.  Vital Signs: Because this visit was a virtual/telehealth visit, some criteria may be missing or patient reported. Any vitals not documented were not able to be obtained and vitals that have been documented are patient reported.  VideoDeclined- This patient declined Librarian, academic. Therefore the visit was completed with audio only.  Persons Participating in Visit: Patient.  AWV Questionnaire: No: Patient Medicare AWV questionnaire was not completed prior to this visit.  Cardiac Risk Factors include: advanced age (>38men, >27 women);dyslipidemia;hypertension;sedentary lifestyle;smoking/ tobacco exposure     Objective:    There were no vitals filed for this visit. There is no height or weight on file to calculate BMI.     11/24/2023   10:58 AM 05/13/2016   10:14 AM 05/27/2015    2:51 PM 04/05/2015    4:45 PM 03/26/2015   10:04 AM 09/20/2014    3:42 PM  Advanced Directives  Does Patient Have a Medical Advance Directive? No No  No  No  No  No   Would patient like information on creating a medical advance directive? No - Patient declined   No - patient declined information  No - patient declined information       Data saved with a previous  flowsheet row definition    Current Medications (verified) Outpatient Encounter Medications as of 11/24/2023  Medication Sig   aspirin  EC 81 MG EC tablet Take 1 tablet (81 mg total) by mouth daily.   Calcium  Citrate-Vitamin D  (CALCIUM  + D PO) Take by mouth.   Cholecalciferol  (VITAMIN D ) 2000 UNITS tablet Take by mouth.   clonazePAM  (KLONOPIN ) 1 MG tablet TAKE ONE TABLET BY MOUTH TWICE DAILY AS NEEDED   losartan -hydrochlorothiazide  (HYZAAR) 50-12.5 MG tablet TAKE ONE TABLET ONCE DAILY (Patient taking differently: Take 1 tablet by mouth daily. TAKES 1/2 TABLET PER DAY)   omeprazole  (PRILOSEC) 20 MG capsule Take 1 capsule (20 mg total) by mouth daily.   rosuvastatin  (CRESTOR ) 40 MG tablet TAKE ONE TABLET BY MOUTH DAILY   sertraline  (ZOLOFT ) 100 MG tablet TAKE 1 TABLET BY MOUTH EVERY DAY   No facility-administered encounter medications on file as of 11/24/2023.    Allergies (verified) Atorvastatin, Oxycodone , and Penicillins   History: Past Medical History:  Diagnosis Date   Abnormal LFTs    (Pt told to cut back on alcohol and tylenol .)   Anxiety    Arthritis    Depression    GERD (gastroesophageal reflux disease)    Hemorrhoids    Hyperlipidemia    Hypertension    Vitamin D  deficiency    Past Surgical History:  Procedure Laterality Date   ANKLE FRACTURE SURGERY  2008   steel plate and screws put in   ENDARTERECTOMY Left 04/05/2015   Procedure: ENDARTERECTOMY CAROTID;  Surgeon: Jama Cordella MATSU, MD;  Location: [facility] ORS;  Service: Vascular;  Laterality: Left;   stone in the salitory gland  2004   TONSILLECTOMY  1960   tumor in salitory gland  1966   removed-non cancerous   Family History  Problem Relation Age of Onset   Anxiety disorder Mother    Hypertension Mother    Hypercholesterolemia Mother    Cancer Mother        Non Hodgkins Lymphoma   Cancer Father        Prostate Cancer   Multiple sclerosis Maternal Aunt        diagnose at age 48   Social History    Socioeconomic History   Marital status: Single    Spouse name: N/A   Number of children: 0   Years of education: some college   Highest education level: Not on file  Occupational History    Employer: [employer]  Tobacco Use   Smoking status: Every Day    Current packs/day: 0.25    Average packs/day: 0.3 packs/day for 30.0 years (7.5 ttl pk-yrs)    Types: Cigarettes   Smokeless tobacco: Never  Substance and Sexual Activity   Alcohol use: Yes    Comment: drinks 2 to 3 beers 2 to 3 times a week.   Drug use: No   Sexual activity: Never  Other Topics Concern   Not on file  Social History Narrative   Not on file   Social Drivers of Health   Financial Resource Strain: Low Risk  (11/24/2023)   Overall Financial Resource Strain (CARDIA)    Difficulty of Paying Living Expenses: Not hard at all  Food Insecurity: No Food Insecurity (11/24/2023)   Hunger Vital Sign    Worried About Running Out of Food in the Last Year: Never true    Ran Out of Food in the Last Year: Never true  Transportation Needs: No Transportation Needs (11/24/2023)   PRAPARE - Administrator, Civil Service (Medical): No    Lack of Transportation (Non-Medical): No  Physical Activity: Inactive (11/24/2023)   Exercise Vital Sign    Days of Exercise per Week: 0 days    Minutes of Exercise per Session: 0 min  Stress: No Stress Concern Present (11/24/2023)   [employer] of Occupational Health - Occupational Stress Questionnaire    Feeling of Stress: Only a little  Social Connections: Socially Isolated (11/24/2023)   Social Connection and Isolation Panel    Frequency of Communication with Friends and Family: More than three times a week    Frequency of Social Gatherings with Friends and Family: More than three times a week    Attends Religious Services: Never    Database administrator or Organizations: No    Attends Engineer, structural: Never    Marital Status: Never married     Tobacco Counseling Ready to quit: Not Answered Counseling given: Not Answered    Clinical Intake:  Pre-visit preparation completed: Yes  Pain : No/denies pain     BMI - recorded: 21.73 Nutritional Status: BMI of 19-24  Normal Nutritional Risks: None Diabetes: No  Lab Results  Component Value Date   HGBA1C 6.3 (H) 07/07/2022     How often do you need to have someone help you when you read instructions, pamphlets, or other written materials from your doctor or pharmacy?: 1 - Never  Interpreter Needed?: No  Information entered by :: JHONNIE DAS, LPN   Activities of Daily Living     11/24/2023   11:01  AM 06/04/2023    1:48 PM  In your present state of health, do you have any difficulty performing the following activities:  Hearing? 1 1  Comment  left ear  Vision? 0 1  Difficulty concentrating or making decisions? 0 0  Walking or climbing stairs? 1 1  Comment PLATE IN ANKLE- HAS TO HOLD ON TO RAIL   Dressing or bathing? 0 0  Doing errands, shopping? 0 0  Preparing Food and eating ? N   Using the Toilet? N   In the past six months, have you accidently leaked urine? N   Do you have problems with loss of bowel control? N   Managing your Medications? N   Managing your Finances? N   Housekeeping or managing your Housekeeping? N     Patient Care Team: Sharma Coyer, MD as PCP - General (Family Medicine) [facility], [facility] (Optometry)  I have updated your Care Teams any recent Medical Services you may have received from other providers in the past year.     Assessment:   This is a routine wellness examination for Joellyn.  Hearing/Vision screen Hearing Screening - Comments:: NO AIDS, NEEDS TO BE TESTED Vision Screening - Comments:: WEARS READERS- [facility]- SEEN EVERY 2 YEARS BUT HASN'T BEEN IN 7 YEARS   Goals Addressed             This Visit's Progress    DIET - EAT MORE FRUITS AND VEGETABLES         Depression Screen      11/24/2023   10:54 AM 06/04/2023    1:44 PM 03/05/2022   11:22 AM 10/14/2021    1:14 PM 06/03/2021    9:31 AM 01/08/2021   11:12 AM 10/09/2020   11:28 AM  PHQ 2/9 Scores  PHQ - 2 Score 2 2 2 2 2 2 2   PHQ- 9 Score 3 5 7 6 4 6 6     Fall Risk     11/24/2023   11:00 AM 06/04/2023    1:46 PM 03/05/2022   11:24 AM 10/14/2021    1:14 PM 06/03/2021    9:31 AM  Fall Risk   Falls in the past year? 1 0 0 0 0  Number falls in past yr: 0 0 0 0   Injury with Fall? 0 0 0 0   Risk for fall due to :  No Fall Risks No Fall Risks No Fall Risks   Follow up Falls evaluation completed;Falls prevention discussed Falls evaluation completed  Falls evaluation completed       Data saved with a previous flowsheet row definition    MEDICARE RISK AT HOME:  Medicare Risk at Home Any stairs in or around the home?: Yes If so, are there any without handrails?: No Home free of loose throw rugs in walkways, pet beds, electrical cords, etc?: Yes Adequate lighting in your home to reduce risk of falls?: Yes Life alert?: No Use of a cane, walker or w/c?: No (HAS CANE & WALKER IN BEDROOM IN CASE) Grab bars in the bathroom?: No Shower chair or bench in shower?: Yes Elevated toilet seat or a handicapped toilet?: No  TIMED UP AND GO:  Was the test performed?  No  Cognitive Function: 6CIT completed        11/24/2023   11:03 AM 10/09/2020   11:27 AM  6CIT Screen  What Year? 0 points 0 points  What month? 0 points 0 points  What time? 0 points 0  points  Count back from 20 0 points 0 points  Months in reverse 0 points 0 points  Repeat phrase 0 points 0 points  Total Score 0 points 0 points    Immunizations Immunization History  Administered Date(s) Administered   Fluad Quad(high Dose 65+) 01/08/2021, 10/14/2021   Influenza Whole 11/12/2015   Influenza,inj,Quad PF,6+ Mos 11/21/2014, 10/08/2016   PFIZER(Purple Top)SARS-COV-2 Vaccination 04/18/2019, 05/16/2019   PNEUMOCOCCAL CONJUGATE-20 03/05/2022   Pneumococcal  Polysaccharide-23 08/15/2012   Tdap 08/15/2012, 07/24/2022   Zoster Recombinant(Shingrix) 08/19/2022    Screening Tests Health Maintenance  Topic Date Due   Fecal DNA (Cologuard)  Never done   Zoster Vaccines- Shingrix (2 of 2) 10/14/2022   Influenza Vaccine  09/03/2023   COVID-19 Vaccine (3 - 2025-26 season) 10/04/2023   Mammogram  11/16/2023   Medicare Annual Wellness (AWV)  11/23/2024   DEXA SCAN  06/11/2026   DTaP/Tdap/Td (3 - Td or Tdap) 07/23/2032   Pneumococcal Vaccine: 50+ Years  Completed   Hepatitis C Screening  Completed   Meningococcal B Vaccine  Aged Out    Health Maintenance Items Addressed: NEEDS FLU; MAMMOGRAM ORDERED; UTD ON BDS; HAS COLOGUARD AT HOME TO DO  Additional Screening:  Vision Screening: Recommended annual ophthalmology exams for early detection of glaucoma and other disorders of the eye. Is the patient up to date with their annual eye exam?  Yes  Who is the provider or what is the name of the office in which the patient attends annual eye exams? [facility]  Dental Screening: Recommended annual dental exams for proper oral hygiene  Community Resource Referral / Chronic Care Management: CRR required this visit?  No   CCM required this visit?  No   Plan:    I have personally reviewed and noted the following in the patient's chart:   Medical and social history Use of alcohol, tobacco or illicit drugs  Current medications and supplements including opioid prescriptions. Patient is not currently taking opioid prescriptions. Functional ability and status Nutritional status Physical activity Advanced directives List of other physicians Hospitalizations, surgeries, and ER visits in previous 12 months Vitals Screenings to include cognitive, depression, and falls Referrals and appointments  In addition, I have reviewed and discussed with patient certain preventive protocols, quality metrics, and best practice recommendations. A written  personalized care plan for preventive services as well as general preventive health recommendations were provided to patient.   Jhonnie GORMAN Das, LPN   [id number]/[id number]   After Visit Summary: (MyChart) Due to this being a telephonic visit, the after visit summary with patients personalized plan was offered to patient via MyChart   Notes: MAMMOGRAM ORDERED

## 2023-12-02 ENCOUNTER — Telehealth: Payer: Self-pay

## 2023-12-02 ENCOUNTER — Encounter: Payer: Self-pay | Admitting: Family Medicine

## 2023-12-02 ENCOUNTER — Ambulatory Visit (INDEPENDENT_AMBULATORY_CARE_PROVIDER_SITE_OTHER): Admitting: Family Medicine

## 2023-12-02 VITALS — BP 118/63 | HR 91 | Temp 98.2°F | Ht 66.5 in | Wt 129.4 lb

## 2023-12-02 DIAGNOSIS — F419 Anxiety disorder, unspecified: Secondary | ICD-10-CM

## 2023-12-02 DIAGNOSIS — Z131 Encounter for screening for diabetes mellitus: Secondary | ICD-10-CM | POA: Diagnosis not present

## 2023-12-02 DIAGNOSIS — E559 Vitamin D deficiency, unspecified: Secondary | ICD-10-CM

## 2023-12-02 DIAGNOSIS — K219 Gastro-esophageal reflux disease without esophagitis: Secondary | ICD-10-CM | POA: Diagnosis not present

## 2023-12-02 DIAGNOSIS — E782 Mixed hyperlipidemia: Secondary | ICD-10-CM | POA: Diagnosis not present

## 2023-12-02 DIAGNOSIS — Z0000 Encounter for general adult medical examination without abnormal findings: Secondary | ICD-10-CM | POA: Diagnosis not present

## 2023-12-02 DIAGNOSIS — I10 Essential (primary) hypertension: Secondary | ICD-10-CM

## 2023-12-02 DIAGNOSIS — Z23 Encounter for immunization: Secondary | ICD-10-CM

## 2023-12-02 DIAGNOSIS — Z5941 Food insecurity: Secondary | ICD-10-CM

## 2023-12-02 DIAGNOSIS — Z130 Encounter for screening for diseases of the blood and blood-forming organs and certain disorders involving the immune mechanism: Secondary | ICD-10-CM

## 2023-12-02 MED ORDER — CLONAZEPAM 1 MG PO TABS: ORAL_TABLET | ORAL | 2 refills | Status: AC

## 2023-12-02 MED ORDER — SERTRALINE HCL 100 MG PO TABS: 50.0000 mg | ORAL_TABLET | Freq: Every day | ORAL | 2 refills | Status: DC

## 2023-12-02 NOTE — Progress Notes (Unsigned)
 Complex Care Management Note Care Guide Note  12/02/2023 Name: Melanie Mitchell MRN: [id number] DOB: [birth year]   Complex Care Management Outreach Attempts: An unsuccessful telephone outreach was attempted today to offer the patient information about available complex care management services.  Follow Up Plan:  Additional outreach attempts will be made to offer the patient complex care management information and services.   Encounter Outcome:  No Answer-Left voicemail  Leotis Rase [facility], [facility] Guide  Direct Dial: [phone]  Fax [phone]

## 2023-12-02 NOTE — Telephone Encounter (Unsigned)
 Copied from CRM [id number]. Topic: Clinical - Prescription Issue >> Dec 02, 2023  3:00 PM Lauren C wrote: Reason for CRM: [facility] pharmacy calling to request clarification on sertaline. It says  Take 0.5 tablets (50 mg total) by mouth daily. TAKE 1 TABLET BY MOUTH EVERY DAY in instructions. Which instructions are correct? Please give them a call at [phone]

## 2023-12-02 NOTE — Progress Notes (Unsigned)
 Complete physical exam   Patient: Melanie Mitchell   DOB: 11-26-1955   68 y.o. Female  MRN: [id number] Visit Date: 12/02/2023  Today's healthcare provider: Rockie Agent, MD   Chief Complaint  Patient presents with   Annual Exam    Patient presents for annual exam with pcp.  Diet is normal per patient. Not currently exercising due to ankle pain and weakness. Will get flu vaccine today, will complete mammogram and cologuard    Subjective    Melanie Mitchell is a 68 y.o. female who presents today for a complete physical exam.    She {does/does not:200015} have additional problems to discuss today.   Discussed the use of AI scribe software for clinical note transcription with the patient, who gave verbal consent to proceed.  History of Present Illness Melanie Mitchell is a 68 year old female who presents for a wellness visit to update her healthcare screenings.  She is here to update her healthcare screenings, including completing her colon cancer screening using Cologuard and scheduling a mammogram for breast cancer screening. She is also receiving a flu shot today.  She has experienced unintentional weight loss, now weighing 124 pounds, which she attributes to financial constraints affecting her ability to purchase food. Her landlord increased rent by $500, impacting her food budget. Despite this, she feels she is eating adequately, although she notes losing weight in her arms and legs, which she does not desire.  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 She has some savings in a 401k and an annuity but is concerned about her financial situation. Her social security check is $1,004, which is largely consumed by rent.  She takes Klonopin  1 mg twice a day and Zoloft  (sertraline ) 100 mg daily, though she usually takes half a tablet, effectively 50 mg daily. She experiences anxiety, particularly when visiting the clinic, and describes  feeling nervous and having a sensation of her heart 'coming out over me' upon arrival.  She has a history of a broken ankle at age 25, which required a steel plate and screws. She now has arthritis, rheumatoid arthritis, and osteoporosis in the ankle, and reports swelling and discomfort. She is concerned about maintaining adequate nutrition, particularly calcium  and vitamin D , for bone health.     Past Medical History:  Diagnosis Date   Abnormal LFTs    (Pt told to cut back on alcohol and tylenol .)   Anxiety    Arthritis    Depression    GERD (gastroesophageal reflux disease)    Hemorrhoids    Hyperlipidemia    Hypertension    Vitamin D  deficiency    Past Surgical History:  Procedure Laterality Date   ANKLE FRACTURE SURGERY  2008   steel plate and screws put in   ENDARTERECTOMY Left 04/05/2015   Procedure: ENDARTERECTOMY CAROTID;  Surgeon: Jama Cordella MATSU, MD;  Location: [facility] ORS;  Service: Vascular;  Laterality: Left;   stone in the salitory gland  2004   TONSILLECTOMY  1960   tumor in salitory gland  1966   removed-non cancerous   Social History   Socioeconomic History   Marital status: Single    Spouse name: N/A   Number of children: 0   Years of education: some college   Highest education level: Not on file  Occupational History    Employer: [employer]  Tobacco Use   Smoking status: Every Day    Current packs/day: 0.25  Average packs/day: 0.3 packs/day for 30.0 years (7.5 ttl pk-yrs)    Types: Cigarettes   Smokeless tobacco: Never   Tobacco comments:    Per patient smokes 3-5 cigarettes per day  Substance and Sexual Activity   Alcohol use: Yes    Comment: drinks 2 to 3 beers 2 to 3 times a week.   Drug use: No   Sexual activity: Never  Other Topics Concern   Not on file  Social History Narrative   Not on file   Social Drivers of Health   Financial Resource Strain: Low Risk  (11/24/2023)   Overall Financial Resource Strain (CARDIA)    Difficulty  of Paying Living Expenses: Not hard at all  Food Insecurity: No Food Insecurity (11/24/2023)   Hunger Vital Sign    Worried About Running Out of Food in the Last Year: Never true    Ran Out of Food in the Last Year: Never true  Transportation Needs: No Transportation Needs (11/24/2023)   PRAPARE - Administrator, Civil Service (Medical): No    Lack of Transportation (Non-Medical): No  Physical Activity: Inactive (11/24/2023)   Exercise Vital Sign    Days of Exercise per Week: 0 days    Minutes of Exercise per Session: 0 min  Stress: No Stress Concern Present (11/24/2023)   [employer] of Occupational Health - Occupational Stress Questionnaire    Feeling of Stress: Only a little  Social Connections: Socially Isolated (11/24/2023)   Social Connection and Isolation Panel    Frequency of Communication with Friends and Family: More than three times a week    Frequency of Social Gatherings with Friends and Family: More than three times a week    Attends Religious Services: Never    Database Administrator or Organizations: No    Attends Banker Meetings: Never    Marital Status: Never married  Intimate Partner Violence: Not At Risk (11/24/2023)   Humiliation, Afraid, Rape, and Kick questionnaire    Fear of Current or Ex-Partner: No    Emotionally Abused: No    Physically Abused: No    Sexually Abused: No   Family Status  Relation Name Status   Mother  Deceased   Father  Deceased at age 58       COPD   Mat Aunt  Alive   MGM  Deceased       MI   MGF  Deceased       Cirrhosis of the liver and he may have had pancreatitis  No partnership data on file   Family History  Problem Relation Age of Onset   Anxiety disorder Mother    Hypertension Mother    Hypercholesterolemia Mother    Cancer Mother        Non Hodgkins Lymphoma   Cancer Father        Prostate Cancer   Multiple sclerosis Maternal Aunt        diagnose at age 45   Allergies  Allergen  Reactions   Atorvastatin Diarrhea   Oxycodone  Nausea And Vomiting    Mental changes   Penicillins Other (See Comments)     Medications: Outpatient Medications Prior to Visit  Medication Sig   aspirin  EC 81 MG EC tablet Take 1 tablet (81 mg total) by mouth daily.   Calcium  Citrate-Vitamin D  (CALCIUM  + D PO) Take by mouth.   Cholecalciferol  (VITAMIN D ) 2000 UNITS tablet Take by mouth.   losartan -hydrochlorothiazide  (HYZAAR) 50-12.5 MG tablet  TAKE ONE TABLET ONCE DAILY (Patient taking differently: Take 1 tablet by mouth daily. TAKES 1/2 TABLET PER DAY)   omeprazole  (PRILOSEC) 20 MG capsule Take 1 capsule (20 mg total) by mouth daily.   rosuvastatin  (CRESTOR ) 40 MG tablet TAKE ONE TABLET BY MOUTH DAILY   [DISCONTINUED] clonazePAM  (KLONOPIN ) 1 MG tablet TAKE ONE TABLET BY MOUTH TWICE DAILY AS NEEDED   [DISCONTINUED] sertraline  (ZOLOFT ) 100 MG tablet TAKE 1 TABLET BY MOUTH EVERY DAY (Patient taking differently: Take 50 mg by mouth daily. Patient takes 1/2 tab daily)   No facility-administered medications prior to visit.    Review of Systems  Last CBC Lab Results  Component Value Date   WBC 6.1 07/07/2022   HGB 13.9 07/07/2022   HCT 42.1 07/07/2022   MCV 89 07/07/2022   MCH 29.4 07/07/2022   RDW 12.9 07/07/2022   PLT 208 07/07/2022   Last metabolic panel Lab Results  Component Value Date   GLUCOSE 104 (H) 07/07/2022   NA 132 (L) 07/07/2022   K 3.9 07/07/2022   CL 92 (L) 07/07/2022   CO2 24 07/07/2022   BUN 16 07/07/2022   CREATININE 0.94 07/07/2022   EGFR 67 07/07/2022   CALCIUM  9.3 07/07/2022   PROT 6.8 07/07/2022   ALBUMIN 4.4 07/07/2022   LABGLOB 2.4 07/07/2022   AGRATIO 1.8 07/07/2022   BILITOT 0.5 07/07/2022   ALKPHOS 114 07/07/2022   AST 29 07/07/2022   ALT 10 07/07/2022   ANIONGAP 5 04/06/2015   Last lipids Lab Results  Component Value Date   CHOL 131 07/07/2022   HDL 54 07/07/2022   LDLCALC 56 07/07/2022   TRIG 120 07/07/2022   CHOLHDL 2.4 07/07/2022    Last hemoglobin A1c Lab Results  Component Value Date   HGBA1C 6.3 (H) 07/07/2022   Last thyroid functions Lab Results  Component Value Date   TSH 3.590 09/27/2019   Last vitamin D  No results found for: 25OHVITD2, 25OHVITD3, VD25OH Last vitamin B12 and Folate No results found for: VITAMINB12, FOLATE   {See past labs  Heme  Chem  Endocrine  Serology  Results Review (optional):1}  Objective    BP 118/63 (BP Location: Right Arm, Patient Position: Sitting, Cuff Size: Normal)   Pulse 91   Temp 98.2 F (36.8 C) (Oral)   Ht 5' 6.5 (1.689 m)   Wt 129 lb 6.4 oz (58.7 kg)   SpO2 98%   BMI 20.57 kg/m  BP Readings from Last 3 Encounters:  12/02/23 118/63  06/04/23 124/64  07/07/22 112/75   Wt Readings from Last 3 Encounters:  12/02/23 129 lb 6.4 oz (58.7 kg)  06/04/23 138 lb 12.8 oz (63 kg)  07/07/22 136 lb 1.6 oz (61.7 kg)    {See vitals history (optional):1}    Physical Exam Vitals reviewed.  Constitutional:      General: She is not in acute distress.    Appearance: Normal appearance. She is not ill-appearing, toxic-appearing or diaphoretic.  HENT:     Head: Normocephalic and atraumatic.     Right Ear: Tympanic membrane and external ear normal. There is no impacted cerumen.     Left Ear: Tympanic membrane and external ear normal. There is no impacted cerumen.     Nose: Nose normal.     Mouth/Throat:     Pharynx: Oropharynx is clear.  Eyes:     General: No scleral icterus.    Extraocular Movements: Extraocular movements intact.     Conjunctiva/sclera: Conjunctivae normal.  Pupils: Pupils are equal, round, and reactive to light.     Comments: Right pupil larger than the left pupil   Cardiovascular:     Rate and Rhythm: Normal rate and regular rhythm.     Pulses: Normal pulses.     Heart sounds: Normal heart sounds. No murmur heard.    No friction rub. No gallop.  Pulmonary:     Effort: Pulmonary effort is normal. No respiratory distress.      Breath sounds: Normal breath sounds. No wheezing, rhonchi or rales.  Abdominal:     General: Bowel sounds are normal. There is no distension.     Palpations: Abdomen is soft. There is no mass.     Tenderness: There is no abdominal tenderness. There is no guarding.  Musculoskeletal:        General: No deformity.     Cervical back: Normal range of motion and neck supple.     Right lower leg: No edema.     Left lower leg: No edema.  Lymphadenopathy:     Cervical: No cervical adenopathy.  Skin:    General: Skin is warm.     Capillary Refill: Capillary refill takes less than 2 seconds.     Findings: No erythema or rash.  Neurological:     General: No focal deficit present.     Mental Status: She is alert and oriented to person, place, and time.     Cranial Nerves: Cranial nerves 2-12 are intact. No cranial nerve deficit or facial asymmetry.     Motor: Motor function is intact. No weakness.     Gait: Gait normal.  Psychiatric:        Mood and Affect: Mood normal.        Behavior: Behavior normal.     ***  Last depression screening scores    12/02/2023    1:50 PM 11/24/2023   10:54 AM 06/04/2023    1:44 PM  PHQ 2/9 Scores  PHQ - 2 Score 2 2 2   PHQ- 9 Score 4 3 5     Last fall risk screening    12/02/2023    1:50 PM  Fall Risk   Falls in the past year? 1  Number falls in past yr: 0  Injury with Fall? 0  Risk for fall due to : History of fall(s)  Follow up Falls evaluation completed    Last Audit-C alcohol use screening    11/24/2023   10:53 AM  Alcohol Use Disorder Test (AUDIT)  1. How often do you have a drink containing alcohol? 3  2. How many drinks containing alcohol do you have on a typical day when you are drinking? 0  3. How often do you have six or more drinks on one occasion? 0  AUDIT-C Score 3  4. How often during the last year have you found that you were not able to stop drinking once you had started? 0  5. How often during the last year have you failed  to do what was normally expected from you because of drinking? 0  6. How often during the last year have you needed a first drink in the morning to get yourself going after a heavy drinking session? 0  7. How often during the last year have you had a feeling of guilt of remorse after drinking? 0  8. How often during the last year have you been unable to remember what happened the night before because you had been drinking? 0  9. Have you or someone else been injured as a result of your drinking? 0  10. Has a relative or friend or a doctor or another health worker been concerned about your drinking or suggested you cut down? 0  Alcohol Use Disorder Identification Test Final Score (AUDIT) 3   A score of 3 or more in women, and 4 or more in men indicates increased risk for alcohol abuse, EXCEPT if all of the points are from question 1   No results found for any visits on 12/02/23.  Assessment & Plan    Routine Health Maintenance and Physical Exam  Immunization History  Administered Date(s) Administered   Fluad Quad(high Dose 65+) 01/08/2021, 10/14/2021   Influenza Whole 11/12/2015   Influenza, Seasonal, Injecte, Preservative Fre 11/12/2015   Influenza,inj,Quad PF,6+ Mos 11/21/2014, 10/08/2016   Influenza,inj,quad, With Preservative 11/21/2014   PFIZER(Purple Top)SARS-COV-2 Vaccination 08/12/2012, 04/18/2019, 05/16/2019   PNEUMOCOCCAL CONJUGATE-20 03/05/2022   Pneumococcal Polysaccharide-23 08/15/2012   Tdap 08/15/2012, 07/24/2022   Zoster Recombinant(Shingrix) 08/19/2022, 02/11/2023    Health Maintenance  Topic Date Due   Fecal DNA (Cologuard)  Never done   Influenza Vaccine  09/03/2023   Mammogram  11/16/2023   COVID-19 Vaccine (4 - 2025-26 season) 12/18/2023 (Originally 10/04/2023)   Medicare Annual Wellness (AWV)  11/23/2024   DEXA SCAN  06/11/2026   DTaP/Tdap/Td (3 - Td or Tdap) 07/23/2032   Pneumococcal Vaccine: 50+ Years  Completed   Hepatitis C Screening  Completed   Zoster  Vaccines- Shingrix  Completed   Meningococcal B Vaccine  Aged Out    Problem List Items Addressed This Visit     Acid reflux   Annual physical exam - Primary   Anxiety   Relevant Medications   sertraline  (ZOLOFT ) 100 MG tablet   Anxiety disorder   Relevant Medications   clonazePAM  (KLONOPIN ) 1 MG tablet   sertraline  (ZOLOFT ) 100 MG tablet   Avitaminosis D   Relevant Orders   VITAMIN D  25 Hydroxy (Vit-D Deficiency, Fractures)   Combined fat and carbohydrate induced hyperlipemia   Relevant Orders   Lipid panel   HLD (hyperlipidemia)   Relevant Orders   Lipid panel   HTN, goal below 130/80   Relevant Orders   CMP14+EGFR   Other Visit Diagnoses       Immunization due       Relevant Orders   Flu vaccine HIGH DOSE PF(Fluzone Trivalent)     Food insecurity       Relevant Orders   AMB Referral [facility] Care Management     Screening for deficiency anemia       Relevant Orders   CBC     Screening for diabetes mellitus       Relevant Orders   Hemoglobin A1c       Assessment and Plan Assessment & Plan Food insecurity Experiencing financial difficulties due to increased rent, impacting ability to purchase food. - Refer to [facility] for assistance with food and other resources  Anxiety disorder Experiencing anxiety, particularly in medical settings. Currently taking Klonopin  1 mg twice a day and Zoloft  (sertraline ) 50 mg daily. - Refill Klonopin  1 mg twice a day - Refill Zoloft  (sertraline ) 100 mg tablets, instruct to take 50 mg daily by splitting tablets  Osteoarthritis and osteoporosis of left ankle Chronic condition with swelling and discomfort in the left ankle. Fracture with steel plate and screws has led to arthritis and osteoporosis. - Ensure adequate nutrition, protein, calcium , and vitamin D  intake  Essential hypertension Blood pressure is well-controlled at 118/63 mmHg.  Mixed hyperlipidemia  Vitamin D  deficiency  Gastroesophageal reflux  disease (GERD)       Return in about 4 months (around 04/01/2024) for Chronic F/U.       Rockie Agent, MD  [facility] [phone] (phone) [phone] (fax)  [facility]

## 2023-12-02 NOTE — Patient Instructions (Addendum)
 It was a pleasure to see you today!  Thank you for choosing [facility] for your primary care.   Today you were seen for your annual physical  Please review the attached information regarding helpful preventive health topics.   To keep you healthy, please keep in mind the following health maintenance items that you are due for:   Health Maintenance Due  Topic Date Due   Fecal DNA (Cologuard)  Never done   Influenza Vaccine  09/03/2023   Mammogram  11/16/2023     Best Wishes,    Dr. Lang

## 2023-12-03 LAB — CBC
Hematocrit: 41.4 % (ref 34.0–46.6)
Hemoglobin: 13.6 g/dL (ref 11.1–15.9)
MCH: 30.5 pg (ref 26.6–33.0)
MCHC: 32.9 g/dL (ref 31.5–35.7)
MCV: 93 fL (ref 79–97)
Platelets: 188 x10E3/uL (ref 150–450)
RBC: 4.46 x10E6/uL (ref 3.77–5.28)
RDW: 12 % (ref 11.7–15.4)
WBC: 5.4 x10E3/uL (ref 3.4–10.8)

## 2023-12-03 LAB — VITAMIN D 25 HYDROXY (VIT D DEFICIENCY, FRACTURES): Vit D, 25-Hydroxy: 71.1 ng/mL (ref 30.0–100.0)

## 2023-12-03 LAB — CMP14+EGFR
ALT: 14 IU/L (ref 0–32)
AST: 38 IU/L (ref 0–40)
Albumin: 4.1 g/dL (ref 3.9–4.9)
Alkaline Phosphatase: 113 IU/L (ref 49–135)
BUN/Creatinine Ratio: 9 — ABNORMAL LOW (ref 12–28)
BUN: 8 mg/dL (ref 8–27)
Bilirubin Total: 0.4 mg/dL (ref 0.0–1.2)
CO2: 26 mmol/L (ref 20–29)
Calcium: 9.8 mg/dL (ref 8.7–10.3)
Chloride: 93 mmol/L — ABNORMAL LOW (ref 96–106)
Creatinine, Ser: 0.87 mg/dL (ref 0.57–1.00)
Globulin, Total: 2.5 g/dL (ref 1.5–4.5)
Glucose: 99 mg/dL (ref 70–99)
Potassium: 4 mmol/L (ref 3.5–5.2)
Sodium: 135 mmol/L (ref 134–144)
Total Protein: 6.6 g/dL (ref 6.0–8.5)
eGFR: 72 mL/min/1.73 (ref >59)

## 2023-12-03 LAB — LIPID PANEL
Chol/HDL Ratio: 2.1 ratio (ref 0.0–4.4)
Cholesterol, Total: 130 mg/dL (ref 100–199)
HDL: 62 mg/dL (ref >39)
LDL Chol Calc (NIH): 51 mg/dL (ref 0–99)
Triglycerides: 88 mg/dL (ref 0–149)
VLDL Cholesterol Cal: 17 mg/dL (ref 5–40)

## 2023-12-03 LAB — HEMOGLOBIN A1C
Est. average glucose Bld gHb Est-mCnc: 126 mg/dL
Hgb A1c MFr Bld: 6 % — ABNORMAL HIGH (ref 4.8–5.6)

## 2023-12-03 MED ORDER — SERTRALINE HCL 100 MG PO TABS: 50.0000 mg | ORAL_TABLET | Freq: Every day | ORAL | 2 refills | Status: AC

## 2023-12-03 NOTE — Assessment & Plan Note (Signed)
 Anxiety disorder, Chronic condition  Experiencing anxiety, particularly in medical settings. Currently taking Klonopin  1 mg twice a day and Zoloft  (sertraline ) 50 mg daily. - Refill Klonopin  1 mg twice a day - Refill Zoloft  (sertraline ) 100 mg tablets, instruct to take 50 mg daily by splitting tablets

## 2023-12-03 NOTE — Assessment & Plan Note (Signed)
 Chronic conditions are stable  Patient was counseled on benefits of regular physical activity with goal of 150 minutes of moderate to vigurous intensity 4 days per week  Patient was counseled to consume well balanced diet of fruits, vegetables, limited saturated fats and limited sugary foods and beverages with emphasis on consuming 6-8 glasses of water daily  Screening recommended today: A1c, lipids,CMP,CBC,Vitamin D   Colon cancer screening: patient has received sample kit, advised to provide sample and return kit as directed for screening     Mammogram: patient will need to scheduled   Vaccines recommended today: influenza vaccine administered today

## 2023-12-03 NOTE — Progress Notes (Unsigned)
 Complex Care Management Note Care Guide Note  12/03/2023 Name: Melanie Mitchell MRN: [id number] DOB: [birth year]   Complex Care Management Outreach Attempts: A second unsuccessful outreach was attempted today to offer the patient with information about available complex care management services.  Follow Up Plan:  Additional outreach attempts will be made to offer the patient complex care management information and services.   Encounter Outcome:  No Answer-Left voicemail  Leotis Rase [facility], [facility] Guide  Direct Dial: [phone]  Fax [phone]

## 2023-12-03 NOTE — Telephone Encounter (Signed)
 Prescription instructions updated for pt to take 50mg  (1/2 tablet daily) for zoloft 

## 2023-12-06 ENCOUNTER — Ambulatory Visit: Payer: Self-pay | Admitting: Family Medicine

## 2023-12-07 NOTE — Progress Notes (Unsigned)
 Complex Care Management Note Care Guide Note  12/07/2023 Name: Melanie Mitchell MRN: [id number] DOB: July 05, 1955   Complex Care Management Outreach Attempts: A third unsuccessful outreach was attempted today to offer the patient with information about available complex care management services.  Follow Up Plan:  No further outreach attempts will be made at this time. We have been unable to contact the patient to offer or enroll patient in complex care management services.  Encounter Outcome:  No Answer-Left Voicemail  Leotis Rase [facility], [facility] Guide  Direct Dial: [phone]  Fax [phone]

## 2023-12-14 ENCOUNTER — Other Ambulatory Visit: Payer: Self-pay | Admitting: Family Medicine

## 2023-12-14 DIAGNOSIS — K219 Gastro-esophageal reflux disease without esophagitis: Secondary | ICD-10-CM

## 2023-12-31 ENCOUNTER — Telehealth: Payer: Self-pay

## 2024-04-03 ENCOUNTER — Ambulatory Visit: Admitting: Family Medicine

## 2024-11-28 ENCOUNTER — Ambulatory Visit
